# Patient Record
Sex: Female | Born: 1945 | Race: White | Hispanic: No | Smoking: Former smoker
Health system: Southern US, Community
[De-identification: ages and names within clinical notes are randomized; demographics above are authoritative.]

## PROBLEM LIST (undated history)

## (undated) DIAGNOSIS — I10 Essential (primary) hypertension: Secondary | ICD-10-CM

## (undated) DIAGNOSIS — H109 Unspecified conjunctivitis: Secondary | ICD-10-CM

## (undated) DIAGNOSIS — F329 Major depressive disorder, single episode, unspecified: Secondary | ICD-10-CM

## (undated) DIAGNOSIS — F32A Depression, unspecified: Secondary | ICD-10-CM

## (undated) DIAGNOSIS — K219 Gastro-esophageal reflux disease without esophagitis: Secondary | ICD-10-CM

## (undated) DIAGNOSIS — E785 Hyperlipidemia, unspecified: Secondary | ICD-10-CM

## (undated) DIAGNOSIS — F988 Other specified behavioral and emotional disorders with onset usually occurring in childhood and adolescence: Secondary | ICD-10-CM

## (undated) DIAGNOSIS — M1990 Unspecified osteoarthritis, unspecified site: Secondary | ICD-10-CM

## (undated) DIAGNOSIS — F419 Anxiety disorder, unspecified: Secondary | ICD-10-CM

## (undated) DIAGNOSIS — F429 Obsessive-compulsive disorder, unspecified: Secondary | ICD-10-CM

## (undated) DIAGNOSIS — M810 Age-related osteoporosis without current pathological fracture: Secondary | ICD-10-CM

## (undated) HISTORY — PX: ABDOMINAL HYSTERECTOMY: SHX81

## (undated) HISTORY — PX: BREAST LUMPECTOMY: SHX2

---

## 1998-10-14 ENCOUNTER — Other Ambulatory Visit: Admission: RE | Admit: 1998-10-14 | Discharge: 1998-10-14 | Payer: Self-pay | Admitting: Gastroenterology

## 1998-10-29 ENCOUNTER — Encounter (INDEPENDENT_AMBULATORY_CARE_PROVIDER_SITE_OTHER): Payer: Self-pay | Admitting: Specialist

## 1998-10-29 ENCOUNTER — Ambulatory Visit (HOSPITAL_COMMUNITY): Admission: RE | Admit: 1998-10-29 | Discharge: 1998-10-29 | Payer: Self-pay | Admitting: Gastroenterology

## 1999-12-29 ENCOUNTER — Encounter: Payer: Self-pay | Admitting: Internal Medicine

## 1999-12-29 ENCOUNTER — Encounter: Admission: RE | Admit: 1999-12-29 | Discharge: 1999-12-29 | Payer: Self-pay | Admitting: Internal Medicine

## 2000-05-30 ENCOUNTER — Encounter: Payer: Self-pay | Admitting: Internal Medicine

## 2000-05-30 ENCOUNTER — Encounter: Admission: RE | Admit: 2000-05-30 | Discharge: 2000-05-30 | Payer: Self-pay | Admitting: Internal Medicine

## 2000-12-29 ENCOUNTER — Encounter: Admission: RE | Admit: 2000-12-29 | Discharge: 2000-12-29 | Payer: Self-pay | Admitting: Internal Medicine

## 2000-12-29 ENCOUNTER — Encounter: Payer: Self-pay | Admitting: Internal Medicine

## 2002-05-08 ENCOUNTER — Encounter: Payer: Self-pay | Admitting: Internal Medicine

## 2002-05-08 ENCOUNTER — Encounter: Admission: RE | Admit: 2002-05-08 | Discharge: 2002-05-08 | Payer: Self-pay | Admitting: Internal Medicine

## 2003-06-09 ENCOUNTER — Ambulatory Visit (HOSPITAL_COMMUNITY): Admission: RE | Admit: 2003-06-09 | Discharge: 2003-06-09 | Payer: Self-pay | Admitting: Internal Medicine

## 2004-02-04 ENCOUNTER — Ambulatory Visit (HOSPITAL_COMMUNITY): Admission: RE | Admit: 2004-02-04 | Discharge: 2004-02-04 | Payer: Self-pay | Admitting: Gastroenterology

## 2004-02-06 ENCOUNTER — Ambulatory Visit (HOSPITAL_COMMUNITY): Admission: RE | Admit: 2004-02-06 | Discharge: 2004-02-06 | Payer: Self-pay | Admitting: Gastroenterology

## 2004-06-09 ENCOUNTER — Ambulatory Visit (HOSPITAL_COMMUNITY): Admission: RE | Admit: 2004-06-09 | Discharge: 2004-06-09 | Payer: Self-pay | Admitting: Internal Medicine

## 2005-06-10 ENCOUNTER — Ambulatory Visit (HOSPITAL_COMMUNITY): Admission: RE | Admit: 2005-06-10 | Discharge: 2005-06-10 | Payer: Self-pay | Admitting: Internal Medicine

## 2006-06-19 ENCOUNTER — Ambulatory Visit (HOSPITAL_COMMUNITY): Admission: RE | Admit: 2006-06-19 | Discharge: 2006-06-19 | Payer: Self-pay | Admitting: *Deleted

## 2006-06-27 ENCOUNTER — Ambulatory Visit (HOSPITAL_COMMUNITY): Admission: RE | Admit: 2006-06-27 | Discharge: 2006-06-27 | Payer: Self-pay | Admitting: *Deleted

## 2007-06-20 ENCOUNTER — Ambulatory Visit (HOSPITAL_COMMUNITY): Admission: RE | Admit: 2007-06-20 | Discharge: 2007-06-20 | Payer: Self-pay | Admitting: Internal Medicine

## 2008-06-20 ENCOUNTER — Ambulatory Visit (HOSPITAL_COMMUNITY): Admission: RE | Admit: 2008-06-20 | Discharge: 2008-06-20 | Payer: Self-pay | Admitting: Family Medicine

## 2008-06-23 ENCOUNTER — Other Ambulatory Visit: Admission: RE | Admit: 2008-06-23 | Discharge: 2008-06-23 | Payer: Self-pay | Admitting: Internal Medicine

## 2008-06-24 ENCOUNTER — Encounter: Admission: RE | Admit: 2008-06-24 | Discharge: 2008-06-24 | Payer: Self-pay | Admitting: Family Medicine

## 2009-06-25 ENCOUNTER — Ambulatory Visit (HOSPITAL_COMMUNITY): Admission: RE | Admit: 2009-06-25 | Discharge: 2009-06-25 | Payer: Self-pay | Admitting: Internal Medicine

## 2009-07-24 ENCOUNTER — Emergency Department (HOSPITAL_COMMUNITY): Admission: EM | Admit: 2009-07-24 | Discharge: 2009-07-25 | Payer: Self-pay | Admitting: Emergency Medicine

## 2010-03-28 ENCOUNTER — Encounter: Payer: Self-pay | Admitting: Family Medicine

## 2010-05-24 LAB — DIFFERENTIAL
Eosinophils Absolute: 0.4 10*3/uL (ref 0.0–0.7)
Lymphocytes Relative: 35 % (ref 12–46)
Lymphs Abs: 3.3 10*3/uL (ref 0.7–4.0)

## 2010-05-24 LAB — CBC
HCT: 40.6 % (ref 36.0–46.0)
Hemoglobin: 13.6 g/dL (ref 12.0–15.0)
MCHC: 33.6 g/dL (ref 30.0–36.0)
MCV: 95.3 fL (ref 78.0–100.0)
RBC: 4.26 MIL/uL (ref 3.87–5.11)

## 2010-05-24 LAB — BASIC METABOLIC PANEL
BUN: 20 mg/dL (ref 6–23)
Calcium: 9.3 mg/dL (ref 8.4–10.5)
Glucose, Bld: 100 mg/dL — ABNORMAL HIGH (ref 70–99)
Potassium: 3.4 mEq/L — ABNORMAL LOW (ref 3.5–5.1)

## 2010-06-10 ENCOUNTER — Other Ambulatory Visit (HOSPITAL_COMMUNITY): Payer: Self-pay | Admitting: Internal Medicine

## 2010-06-10 DIAGNOSIS — Z1231 Encounter for screening mammogram for malignant neoplasm of breast: Secondary | ICD-10-CM

## 2010-06-29 ENCOUNTER — Ambulatory Visit (HOSPITAL_COMMUNITY)
Admission: RE | Admit: 2010-06-29 | Discharge: 2010-06-29 | Disposition: A | Discharge status: Home / Self Care | Payer: BC Managed Care – PPO | Source: Ambulatory Visit | Attending: Internal Medicine | Admitting: Internal Medicine

## 2010-06-29 DIAGNOSIS — Z1231 Encounter for screening mammogram for malignant neoplasm of breast: Secondary | ICD-10-CM | POA: Insufficient documentation

## 2010-07-23 NOTE — Op Note (Signed)
NAMEMORENIKE, CUFF            ACCOUNT NO.:  [id number]   MEDICAL RECORD NO.:  [id number]          PATIENT TYPE:  AMB   LOCATION:  ENDO                         FACILITY:  [facility]   PHYSICIAN:  Danise Edge, M.D.   DATE OF BIRTH:  1946-02-13   DATE OF PROCEDURE:  02/04/2004  DATE OF DISCHARGE:                                 OPERATIVE REPORT   PROCEDURE PERFORMED:  Incomplete screening colonoscopy.   ENDOSCOPIST:  Charolett Bumpers, M.D.   INDICATIONS FOR PROCEDURE:  Ms. Kiaja Shorty is a 65 year old female  born 03/22/1945.  Approximately five years ago, Ms. Dunkerson underwent a  proctocolonoscopy to the cecum performed by me; two small adenomatous polyps  were removed from her colon.  She is scheduled for surveillance colonoscopy  with polypectomy to prevent colon cancer.   PREMEDICATION:  Versed 6 mg, Demerol 60 mg.   DESCRIPTION OF PROCEDURE:  After obtaining informed consent, Ms. Huntoon was  placed in the left lateral decubitus position.  I administered intravenous  Demerol and intravenous Versed to achieve conscious sedation for the  procedure.  The patient's blood pressure, oxygen saturations and cardiac  rhythm were monitored throughout the procedure and documented in the medical  record.   Anal inspection was normal.  Digital rectal exam was normal.  The pediatric  Olympus video colonoscope was introduced into the rectum and advanced to  hepatic flexure.  Colonic loop formation prevented advancement of the  endoscope around the hepatic flexure into the ascending colon.  The  ascending colon, cecum and ileocecal valve were not examined.  Colonic  preparation for the colonoscopy today was excellent.   Rectum:  Normal.   Sigmoid colon and descending colon:  Normal.   Splenic flexure:  Normal.   Transverse colon:  Normal.   Hepatic flexure:  Normal.   Ascending colon, cecum and ileocecal valve were not examined.   RECOMMENDATIONS:  Schedule elective  outpatient air contrast barium enema.      MJ/MEDQ  D:  02/04/2004  T:  02/04/2004  Job:  [id number]   cc:   Marcene Duos, M.D.  [place]  [place]  [place] [address]  Fax: [phone]

## 2011-03-29 DIAGNOSIS — E7800 Pure hypercholesterolemia, unspecified: Secondary | ICD-10-CM | POA: Diagnosis not present

## 2011-04-12 DIAGNOSIS — E7800 Pure hypercholesterolemia, unspecified: Secondary | ICD-10-CM | POA: Diagnosis not present

## 2011-04-12 DIAGNOSIS — F411 Generalized anxiety disorder: Secondary | ICD-10-CM | POA: Diagnosis not present

## 2011-06-20 DIAGNOSIS — E559 Vitamin D deficiency, unspecified: Secondary | ICD-10-CM | POA: Diagnosis not present

## 2011-06-20 DIAGNOSIS — E7800 Pure hypercholesterolemia, unspecified: Secondary | ICD-10-CM | POA: Diagnosis not present

## 2011-06-20 DIAGNOSIS — I10 Essential (primary) hypertension: Secondary | ICD-10-CM | POA: Diagnosis not present

## 2011-06-22 ENCOUNTER — Other Ambulatory Visit (HOSPITAL_COMMUNITY): Payer: Self-pay | Admitting: Internal Medicine

## 2011-06-22 DIAGNOSIS — Z1231 Encounter for screening mammogram for malignant neoplasm of breast: Secondary | ICD-10-CM

## 2011-06-23 DIAGNOSIS — F411 Generalized anxiety disorder: Secondary | ICD-10-CM | POA: Diagnosis not present

## 2011-06-23 DIAGNOSIS — I10 Essential (primary) hypertension: Secondary | ICD-10-CM | POA: Diagnosis not present

## 2011-06-23 DIAGNOSIS — Z23 Encounter for immunization: Secondary | ICD-10-CM | POA: Diagnosis not present

## 2011-06-23 DIAGNOSIS — E559 Vitamin D deficiency, unspecified: Secondary | ICD-10-CM | POA: Diagnosis not present

## 2011-06-23 DIAGNOSIS — E7800 Pure hypercholesterolemia, unspecified: Secondary | ICD-10-CM | POA: Diagnosis not present

## 2011-07-19 ENCOUNTER — Ambulatory Visit (HOSPITAL_COMMUNITY): Payer: BC Managed Care – PPO

## 2011-08-09 ENCOUNTER — Ambulatory Visit (HOSPITAL_COMMUNITY)
Admission: RE | Admit: 2011-08-09 | Discharge: 2011-08-09 | Disposition: A | Discharge status: Home / Self Care | Payer: Medicare Other | Source: Ambulatory Visit | Attending: Internal Medicine | Admitting: Internal Medicine

## 2011-08-09 DIAGNOSIS — Z1231 Encounter for screening mammogram for malignant neoplasm of breast: Secondary | ICD-10-CM

## 2011-08-16 ENCOUNTER — Ambulatory Visit (HOSPITAL_COMMUNITY): Payer: BC Managed Care – PPO

## 2011-11-21 DIAGNOSIS — Z23 Encounter for immunization: Secondary | ICD-10-CM | POA: Diagnosis not present

## 2011-12-16 DIAGNOSIS — E7800 Pure hypercholesterolemia, unspecified: Secondary | ICD-10-CM | POA: Diagnosis not present

## 2011-12-16 DIAGNOSIS — E559 Vitamin D deficiency, unspecified: Secondary | ICD-10-CM | POA: Diagnosis not present

## 2011-12-16 DIAGNOSIS — I10 Essential (primary) hypertension: Secondary | ICD-10-CM | POA: Diagnosis not present

## 2011-12-22 DIAGNOSIS — F329 Major depressive disorder, single episode, unspecified: Secondary | ICD-10-CM | POA: Diagnosis not present

## 2011-12-22 DIAGNOSIS — I10 Essential (primary) hypertension: Secondary | ICD-10-CM | POA: Diagnosis not present

## 2011-12-22 DIAGNOSIS — E7800 Pure hypercholesterolemia, unspecified: Secondary | ICD-10-CM | POA: Diagnosis not present

## 2012-01-22 ENCOUNTER — Emergency Department (HOSPITAL_COMMUNITY): Payer: Medicare Other

## 2012-01-22 ENCOUNTER — Encounter (HOSPITAL_COMMUNITY): Payer: Self-pay | Admitting: Emergency Medicine

## 2012-01-22 ENCOUNTER — Emergency Department (HOSPITAL_COMMUNITY)
Admission: EM | Admit: 2012-01-22 | Discharge: 2012-01-22 | Disposition: A | Discharge status: Home / Self Care | Payer: Medicare Other | Attending: Emergency Medicine | Admitting: Emergency Medicine

## 2012-01-22 DIAGNOSIS — Z87891 Personal history of nicotine dependence: Secondary | ICD-10-CM | POA: Insufficient documentation

## 2012-01-22 DIAGNOSIS — E785 Hyperlipidemia, unspecified: Secondary | ICD-10-CM | POA: Diagnosis not present

## 2012-01-22 DIAGNOSIS — Z7982 Long term (current) use of aspirin: Secondary | ICD-10-CM | POA: Diagnosis not present

## 2012-01-22 DIAGNOSIS — M2550 Pain in unspecified joint: Secondary | ICD-10-CM | POA: Insufficient documentation

## 2012-01-22 DIAGNOSIS — R51 Headache: Secondary | ICD-10-CM | POA: Insufficient documentation

## 2012-01-22 DIAGNOSIS — R109 Unspecified abdominal pain: Secondary | ICD-10-CM | POA: Diagnosis not present

## 2012-01-22 DIAGNOSIS — S298XXA Other specified injuries of thorax, initial encounter: Secondary | ICD-10-CM | POA: Diagnosis not present

## 2012-01-22 DIAGNOSIS — S0993XA Unspecified injury of face, initial encounter: Secondary | ICD-10-CM | POA: Diagnosis not present

## 2012-01-22 DIAGNOSIS — W19XXXA Unspecified fall, initial encounter: Secondary | ICD-10-CM

## 2012-01-22 DIAGNOSIS — S20219A Contusion of unspecified front wall of thorax, initial encounter: Secondary | ICD-10-CM | POA: Diagnosis not present

## 2012-01-22 DIAGNOSIS — Z8659 Personal history of other mental and behavioral disorders: Secondary | ICD-10-CM | POA: Diagnosis not present

## 2012-01-22 DIAGNOSIS — S2249XA Multiple fractures of ribs, unspecified side, initial encounter for closed fracture: Secondary | ICD-10-CM | POA: Diagnosis not present

## 2012-01-22 DIAGNOSIS — S3981XA Other specified injuries of abdomen, initial encounter: Secondary | ICD-10-CM | POA: Diagnosis not present

## 2012-01-22 DIAGNOSIS — Z79899 Other long term (current) drug therapy: Secondary | ICD-10-CM | POA: Diagnosis not present

## 2012-01-22 DIAGNOSIS — F329 Major depressive disorder, single episode, unspecified: Secondary | ICD-10-CM | POA: Insufficient documentation

## 2012-01-22 DIAGNOSIS — W1811XA Fall from or off toilet without subsequent striking against object, initial encounter: Secondary | ICD-10-CM | POA: Insufficient documentation

## 2012-01-22 DIAGNOSIS — F3289 Other specified depressive episodes: Secondary | ICD-10-CM | POA: Insufficient documentation

## 2012-01-22 DIAGNOSIS — I10 Essential (primary) hypertension: Secondary | ICD-10-CM | POA: Diagnosis not present

## 2012-01-22 DIAGNOSIS — Y9389 Activity, other specified: Secondary | ICD-10-CM | POA: Insufficient documentation

## 2012-01-22 DIAGNOSIS — M810 Age-related osteoporosis without current pathological fracture: Secondary | ICD-10-CM | POA: Diagnosis not present

## 2012-01-22 DIAGNOSIS — S0990XA Unspecified injury of head, initial encounter: Secondary | ICD-10-CM | POA: Diagnosis not present

## 2012-01-22 DIAGNOSIS — Y929 Unspecified place or not applicable: Secondary | ICD-10-CM | POA: Insufficient documentation

## 2012-01-22 HISTORY — DX: Anxiety disorder, unspecified: F41.9

## 2012-01-22 HISTORY — DX: Unspecified conjunctivitis: H10.9

## 2012-01-22 HISTORY — DX: Major depressive disorder, single episode, unspecified: F32.9

## 2012-01-22 HISTORY — DX: Hyperlipidemia, unspecified: E78.5

## 2012-01-22 HISTORY — DX: Depression, unspecified: F32.A

## 2012-01-22 HISTORY — DX: Essential (primary) hypertension: I10

## 2012-01-22 HISTORY — DX: Age-related osteoporosis without current pathological fracture: M81.0

## 2012-01-22 LAB — BASIC METABOLIC PANEL
BUN: 20 mg/dL (ref 6–23)
CO2: 28 mEq/L (ref 19–32)
Chloride: 98 mEq/L (ref 96–112)
GFR calc Af Amer: >90 mL/min (ref >90)
Glucose, Bld: 100 mg/dL — ABNORMAL HIGH (ref 70–99)
Potassium: 4.8 mEq/L (ref 3.5–5.1)
Sodium: 137 mEq/L (ref 135–145)

## 2012-01-22 LAB — CBC WITH DIFFERENTIAL/PLATELET
Hemoglobin: 13.4 g/dL (ref 12.0–15.0)
Lymphocytes Relative: 26 % (ref 12–46)
Lymphs Abs: 2.4 10*3/uL (ref 0.7–4.0)
MCH: 33.7 pg (ref 26.0–34.0)
Monocytes Relative: 8 % (ref 3–12)
Neutro Abs: 6.1 10*3/uL (ref 1.7–7.7)
Neutrophils Relative %: 66 % (ref 43–77)
Platelets: 248 10*3/uL (ref 150–400)
RBC: 3.98 MIL/uL (ref 3.87–5.11)
WBC: 9.3 10*3/uL (ref 4.0–10.5)

## 2012-01-22 LAB — PROTIME-INR: Prothrombin Time: 12.1 seconds (ref 11.6–15.2)

## 2012-01-22 MED ORDER — HYDROCODONE-ACETAMINOPHEN 5-325 MG PO TABS: 2.0000 | ORAL_TABLET | ORAL | Status: DC | PRN

## 2012-01-22 MED ORDER — IOHEXOL 300 MG/ML  SOLN
100.0000 mL | Freq: Once | INTRAMUSCULAR | Status: AC | PRN
  Administered 2012-01-22: 100 mL via INTRAVENOUS

## 2012-01-22 MED ORDER — HYDROCODONE-ACETAMINOPHEN 5-325 MG PO TABS
2.0000 | ORAL_TABLET | Freq: Once | ORAL | Status: AC
  Administered 2012-01-22: 2 via ORAL
  Filled 2012-01-22: qty 2

## 2012-01-22 MED ORDER — IBUPROFEN 600 MG PO TABS: 600.0000 mg | ORAL_TABLET | Freq: Four times a day (QID) | ORAL | Status: DC | PRN

## 2012-01-22 NOTE — ED Provider Notes (Signed)
History     CSN: [id number]  Arrival date & time 01/22/12  1553   First MD Initiated Contact with Patient 01/22/12 1642      Chief Complaint  Patient presents with  . Fall    (Consider location/radiation/quality/duration/timing/severity/associated sxs/prior treatment) HPI Comments: Patient presents with pain in her thoracic spine, left ribs, head and neck after a fall last night. She states she was sitting on the toilet and fell to the side because she was tired and struck her back on a ceramic toilet paper holder broke. She denies hitting her head or losing consciousness. She is uncertain how long she laid on the ground but was able to get up on her own. This happened at 11 PM. She denies any nausea, vomiting, chest pain or abdominal pain. She denies any focal weakness, numbness or tingling. She is not taking anticoagulants. She's complaining of pain in her left posterior ribs it radiates from her left chest. She did not take anything for pain.  The history is provided by the patient and a relative.    Past Medical History  Diagnosis Date  . Hypertension   . Osteoporosis   . Hyperlipemia   . Depression   . Anxiety   . Conjunctivitis     Past Surgical History  Procedure Date  . Abdominal hysterectomy     No family history on file.  History  Substance Use Topics  . Smoking status: Former Smoker -- 1.5 packs/day    Types: Cigarettes    Quit date: 03/07/1984  . Smokeless tobacco: Never Used  . Alcohol Use: 12.6 oz/week    21 Glasses of wine per week    OB History    Grav Para Term Preterm Abortions TAB SAB Ect Mult Living                  Review of Systems  Constitutional: Negative for fever, activity change and appetite change.  HENT: Negative for congestion and rhinorrhea.   Eyes: Negative for visual disturbance.  Respiratory: Negative for cough, chest tightness and shortness of breath.   Cardiovascular: Negative for chest pain.  Gastrointestinal: Negative  for nausea, vomiting and abdominal pain.  Musculoskeletal: Positive for myalgias, back pain and arthralgias.  Skin: Negative for rash.  Neurological: Positive for headaches. Negative for dizziness and weakness.    Allergies  Percocet  Home Medications   Current Outpatient Rx  Name  Route  Sig  Dispense  Refill  . ASPIRIN EC 81 MG PO TBEC   Oral   Take 81 mg by mouth daily.         . AZELASTINE HCL 0.05 % OP SOLN   Both Eyes   Place 1 drop into both eyes 2 (two) times daily.         . B COMPLEX PO TABS   Oral   Take 1 tablet by mouth daily.         Marland Kitchen CALCIUM CARBONATE-VITAMIN D 500-200 MG-UNIT PO TABS   Oral   Take 1 tablet by mouth daily.         Marland Kitchen VITAMIN D 1000 UNITS PO TABS   Oral   Take 2,000 Units by mouth daily.         Marland Kitchen CLONAZEPAM 0.5 MG PO TABS   Oral   Take 0.5 mg by mouth daily.         . COQ-10 PO   Oral   Take 500 mg by mouth daily.         Marland Kitchen  ESCITALOPRAM OXALATE 20 MG PO TABS   Oral   Take 20 mg by mouth daily.         . OMEGA-3 FATTY ACIDS 1000 MG PO CAPS   Oral   Take 1 g by mouth daily.         . ADULT MULTIVITAMIN W/MINERALS CH   Oral   Take 1 tablet by mouth daily.         Marland Kitchen ESTROVEN ENERGY PO   Oral   Take 1 tablet by mouth daily.         Marland Kitchen PRAVASTATIN SODIUM 80 MG PO TABS   Oral   Take 80 mg by mouth daily.         Marland Kitchen RAMIPRIL 10 MG PO CAPS   Oral   Take 10 mg by mouth daily.           BP 123/73  Pulse 56  Temp 98.1 F (36.7 C) (Oral)  Resp 20  Ht 5\' 1"  (1.549 m)  Wt 115 lb (52.164 kg)  BMI 21.73 kg/m2  SpO2 93%  Physical Exam  Constitutional: She is oriented to person, place, and time. She appears well-developed and well-nourished. No distress.  HENT:  Head: Normocephalic and atraumatic.  Mouth/Throat: Oropharynx is clear and moist. No oropharyngeal exudate.  Eyes: Conjunctivae normal are normal. Pupils are equal, round, and reactive to light.  Neck: Normal range of motion. Neck supple.         No C-spine pain for deformity a, stepoff or deformity  Cardiovascular: Normal rate, regular rhythm and normal heart sounds.   No murmur heard. Pulmonary/Chest: Effort normal and breath sounds normal. No respiratory distress.  Abdominal: Soft. There is no tenderness. There is no rebound and no guarding.  Musculoskeletal: Normal range of motion. She exhibits tenderness.       Tender to palpation over left posterior ribs in the mid thoracic area. No bruising, ecchymosis or step-off. There is no midline T. or L-spine pain.  Neurological: She is alert and oriented to person, place, and time. No cranial nerve deficit. She exhibits normal muscle tone. Coordination normal.       5/5 strength throughout, no ataxia on finger to nose, CN 2-12 intact.  Skin: Skin is warm.    ED Course  Procedures (including critical care time)  Labs Reviewed  BASIC METABOLIC PANEL - Abnormal; Notable for the following:    Glucose, Bld 100 (*)     All other components within normal limits  CBC WITH DIFFERENTIAL  PROTIME-INR  TROPONIN I   Dg Ribs Unilateral W/chest Left  01/22/2012  *RADIOLOGY REPORT*  Clinical Data: Left anterior mid rib pain, fall  LEFT RIBS AND CHEST - 3+ VIEW  Comparison: None  Findings: Upper-normal size of cardiac silhouette. Mediastinal contours and pulmonary vascularity normal. Lungs clear. No pleural effusion or pneumothorax. Minimal peribronchial thickening. Diffuse osseous demineralization. Age indeterminate fractures of the anterolateral left sixth and seventh ribs.  IMPRESSION: Minimal bronchitic changes. Age indeterminate fractures of the anterolateral left sixth and seventh ribs.   Original Report Authenticated By: Ulyses Southward, M.D.    Ct Head Wo Contrast  01/22/2012  *RADIOLOGY REPORT*  Clinical Data:  History of trauma from a fall.  CT HEAD WITHOUT CONTRAST CT CERVICAL SPINE WITHOUT CONTRAST  Technique:  Multidetector CT imaging of the head and cervical spine was performed  following the standard protocol without intravenous contrast.  Multiplanar CT image reconstructions of the cervical spine were also generated.  Comparison:  No priors.  CT HEAD  Findings: There is a background of mild cerebral and cerebellar atrophy in addition to extensive patchy and confluent areas of decreased attenuation throughout the deep and periventricular white matter of the cerebral hemispheres bilaterally, most compatible with chronic microvascular ischemic disease.  No acute displaced skull fractures are identified.  No acute intracranial abnormality. Specifically, no evidence of acute post-traumatic intracranial hemorrhage, no definite regions of acute/subacute cerebral ischemia, no focal mass, mass effect, hydrocephalus or abnormal intra or extra-axial fluid collections.  The visualized paranasal sinuses and mastoids are well pneumatized.  IMPRESSION: 1.  No acute displaced skull fractures or acute intracranial abnormalities. 2.  Mild cerebral and cerebellar atrophy in addition to chronic microvascular ischemic changes in the cerebral white matter, as above.  CT CERVICAL SPINE  Findings: No acute displaced cervical spine fractures.  Alignment is anatomic.  Prevertebral soft tissues are normal.  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  Mild multilevel degenerative disc disease and facet arthropathy is also noted.  Visualized portions of the upper thorax are unremarkable.  IMPRESSION: 1.  No evidence of significant acute traumatic injury to the cervical spine. 2.  Mild multilevel degenerative disc disease and cervical spondylosis. 3.  Congenital fusion of C3 and C4, as above.   Original Report Authenticated By: Trudie Reed, M.D.    Ct Chest W Contrast  01/22/2012  *RADIOLOGY REPORT*  Clinical Data:  Fall.  Upper back pain.  Hysterectomy.  Left-sided shoulder pain.  CT CHEST, ABDOMEN AND PELVIS WITH CONTRAST  Technique:  Multidetector CT  imaging of the chest, abdomen and pelvis was performed following the standard protocol during bolus administration of intravenous contrast.  Contrast: OMNIPAQUE IOHEXOL 300 MG/ML  SOLN  Comparison:   None.  CT CHEST  Findings:  No pneumothorax.  Scapula appears intact.  Sternum intact.  The no displaced rib fractures are identified.  Thoracic spinal alignment is within normal limits.  Vertebral body height is normal.  Mild thoracic degenerative disease.  No axillary, mediastinal, or hilar adenopathy. Aorta and branch vessels appear normal.  No airspace disease in the lungs.  5 millimeter right upper lobe pulmonary nodule (image 26 series 8). Calcified granuloma in the right lower lobe (image number 43 series 3.  IMPRESSION:  1.  No acute injury to the chest. 2.  5 mm right upper lobe peripheral pulmonary nodule. If the patient is at high risk for bronchogenic carcinoma, follow-up chest CT at 6-12 months is recommended.  If the patient is at low risk for bronchogenic carcinoma, follow-up chest CT at 12 months is recommended.  This recommendation follows the consensus statement: Guidelines for Management of Small Pulmonary Nodules Detected on CT Scans: A Statement from the Fleischner Society as published in Radiology 2005; 237:395-400. 3.  Old granulomatous disease.  CT ABDOMEN AND PELVIS  Findings:  Liver and spleen normal.  Gallbladder normal.  Common bile duct and pancreas normal.  Normal adrenal glands.  Normal renal enhancement and excretion.  The ureters appear within normal limits.  Urinary bladder normal.  Hysterectomy.  Stomach and small bowel appear normal.  The colon appears within normal limits. Lower lumbar spondylosis and facet arthrosis.  Pubic symphysis degenerative disease.  Vertebral body height in the lumbar spine is preserved.  Mild atherosclerosis.  IMPRESSION:  No traumatic injury to the abdomen or pelvis.  Hysterectomy.   Original Report Authenticated By: Andreas Newport, M.D.    Ct  Cervical Spine Wo Contrast  01/22/2012  *RADIOLOGY REPORT*  Clinical Data:  History of trauma from a fall.  CT HEAD WITHOUT CONTRAST CT CERVICAL SPINE WITHOUT CONTRAST  Technique:  Multidetector CT imaging of the head and cervical spine was performed following the standard protocol without intravenous contrast.  Multiplanar CT image reconstructions of the cervical spine were also generated.  Comparison:  No priors.  CT HEAD  Findings: There is a background of mild cerebral and cerebellar atrophy in addition to extensive patchy and confluent areas of decreased attenuation throughout the deep and periventricular white matter of the cerebral hemispheres bilaterally, most compatible with chronic microvascular ischemic disease.  No acute displaced skull fractures are identified.  No acute intracranial abnormality. Specifically, no evidence of acute post-traumatic intracranial hemorrhage, no definite regions of acute/subacute cerebral ischemia, no focal mass, mass effect, hydrocephalus or abnormal intra or extra-axial fluid collections.  The visualized paranasal sinuses and mastoids are well pneumatized.  IMPRESSION: 1.  No acute displaced skull fractures or acute intracranial abnormalities. 2.  Mild cerebral and cerebellar atrophy in addition to chronic microvascular ischemic changes in the cerebral white matter, as above.  CT CERVICAL SPINE  Findings: No acute displaced cervical spine fractures.  Alignment is anatomic.  Prevertebral soft tissues are normal.  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  Mild multilevel degenerative disc disease and facet arthropathy is also noted.  Visualized portions of the upper thorax are unremarkable.  IMPRESSION: 1.  No evidence of significant acute traumatic injury to the cervical spine. 2.  Mild multilevel degenerative disc disease and cervical spondylosis. 3.  Congenital fusion of C3 and C4, as above.   Original Report  Authenticated By: Trudie Reed, M.D.    Ct Abdomen Pelvis W Contrast  01/22/2012  *RADIOLOGY REPORT*  Clinical Data:  Fall.  Upper back pain.  Hysterectomy.  Left-sided shoulder pain.  CT CHEST, ABDOMEN AND PELVIS WITH CONTRAST  Technique:  Multidetector CT imaging of the chest, abdomen and pelvis was performed following the standard protocol during bolus administration of intravenous contrast.  Contrast: OMNIPAQUE IOHEXOL 300 MG/ML  SOLN  Comparison:   None.  CT CHEST  Findings:  No pneumothorax.  Scapula appears intact.  Sternum intact.  The no displaced rib fractures are identified.  Thoracic spinal alignment is within normal limits.  Vertebral body height is normal.  Mild thoracic degenerative disease.  No axillary, mediastinal, or hilar adenopathy. Aorta and branch vessels appear normal.  No airspace disease in the lungs.  5 millimeter right upper lobe pulmonary nodule (image 26 series 8). Calcified granuloma in the right lower lobe (image number 43 series 3.  IMPRESSION:  1.  No acute injury to the chest. 2.  5 mm right upper lobe peripheral pulmonary nodule. If the patient is at high risk for bronchogenic carcinoma, follow-up chest CT at 6-12 months is recommended.  If the patient is at low risk for bronchogenic carcinoma, follow-up chest CT at 12 months is recommended.  This recommendation follows the consensus statement: Guidelines for Management of Small Pulmonary Nodules Detected on CT Scans: A Statement from the Fleischner Society as published in Radiology 2005; 237:395-400. 3.  Old granulomatous disease.  CT ABDOMEN AND PELVIS  Findings:  Liver and spleen normal.  Gallbladder normal.  Common bile duct and pancreas normal.  Normal adrenal glands.  Normal renal enhancement and excretion.  The ureters appear within normal limits.  Urinary bladder normal.  Hysterectomy.  Stomach and small bowel appear normal.  The colon appears within normal limits. Lower lumbar spondylosis and facet arthrosis.   Pubic symphysis degenerative disease.  Vertebral body height in the lumbar spine is preserved.  Mild atherosclerosis.  IMPRESSION:  No traumatic injury to the abdomen or pelvis.  Hysterectomy.   Original Report Authenticated By: Andreas Newport, M.D.      No diagnosis found.    MDM  Back and rib pain after fall last night. Patient's uncertain of loss of consciousness. She has no focal neurological deficits her vital signs are stable. She has no abdominal pain or chest pain. His pain over her left posterior ribs it radiates from the front side  Imaging is negative for acute traumatic injury. Patient is informed of lung nodule finding and recommendation for repeat CT scan 1 year. Patient is ambulatory in the ED and tolerating PO.   EMERGENCY DEPARTMENT [place] FAST EXAM  INDICATIONS:Blunt trauma to the Thorax and Blunt injury of abdomen  PERFORMED BY: Myself  IMAGES ARCHIVED?: No  FINDINGS: All views negative  LIMITATIONS:    INTERPRETATION:  No abdominal free fluid and No pericardial effusion  COMMENT:  No free fluid in abdomen.   Date: 01/22/2012  Rate: 58  Rhythm: normal sinus rhythm  QRS Axis: normal  Intervals: normal  ST/T Wave abnormalities: normal  Conduction Disutrbances:none  Narrative Interpretation:   Old EKG Reviewed: none available    Glynn Octave, MD 01/22/12 1956

## 2012-01-22 NOTE — ED Notes (Signed)
Patient transported to CT 

## 2012-01-22 NOTE — ED Notes (Signed)
YNW:[id number]> Expected date:<BR> Expected time:<BR> Means of arrival:<BR> Comments:<BR> Hold for [employer]

## 2012-01-22 NOTE — ED Notes (Signed)
Pt complains of 8/10 upper back pain following an unwitnessed fall. Pt reports that she fell backwards last night and hit her head. Small hematoma noted, pt is unsure if she lost consciousness. Neuro check unremarkable.

## 2012-01-22 NOTE — ED Notes (Signed)
Patient transported to X-ray 

## 2012-03-09 ENCOUNTER — Encounter (HOSPITAL_COMMUNITY): Payer: Self-pay

## 2012-03-09 ENCOUNTER — Emergency Department (INDEPENDENT_AMBULATORY_CARE_PROVIDER_SITE_OTHER)
Admission: EM | Admit: 2012-03-09 | Discharge: 2012-03-09 | Disposition: A | Discharge status: Home / Self Care | Payer: Medicare Other | Source: Home / Self Care | Attending: Emergency Medicine | Admitting: Emergency Medicine

## 2012-03-09 DIAGNOSIS — S0100XA Unspecified open wound of scalp, initial encounter: Secondary | ICD-10-CM | POA: Diagnosis not present

## 2012-03-09 DIAGNOSIS — S0101XA Laceration without foreign body of scalp, initial encounter: Secondary | ICD-10-CM

## 2012-03-09 DIAGNOSIS — S0990XA Unspecified injury of head, initial encounter: Secondary | ICD-10-CM

## 2012-03-09 DIAGNOSIS — Z23 Encounter for immunization: Secondary | ICD-10-CM

## 2012-03-09 MED ORDER — TETANUS-DIPHTH-ACELL PERTUSSIS 5-2.5-18.5 LF-MCG/0.5 IM SUSP
0.5000 mL | Freq: Once | INTRAMUSCULAR | Status: AC
  Administered 2012-03-09: 0.5 mL via INTRAMUSCULAR

## 2012-03-09 MED ORDER — TETANUS-DIPHTH-ACELL PERTUSSIS 5-2.5-18.5 LF-MCG/0.5 IM SUSP
INTRAMUSCULAR | Status: AC
  Filled 2012-03-09: qty 0.5

## 2012-03-09 NOTE — ED Provider Notes (Signed)
History     CSN: [id number]  Arrival date & time 03/09/12  1056   First MD Initiated Contact with Patient 03/09/12 1228      Chief Complaint  Patient presents with  . Fall    (Consider location/radiation/quality/duration/timing/severity/associated sxs/prior treatment) HPI Comments: Patient presents urgent care describing that last night she fell she was trying to walk through a narrowand slipped hit the back of her head and some granite, which was broken. She bled some subsequently stopped. "I'm not sure but I think I  MIGHT passed out for a second or 2"... " I I do remember everything that happened, I think I might have had a couple of extra glasses of wine". "I decided to come in this morning to have the wound checked in case it needed stitches "?   No vomiting, no nausea no dizziness no visual changes, soreness is around the area of the cut.  Patient is a 67 y.o. female presenting with head injury. The history is provided by the patient.  Head Injury  The incident occurred yesterday. She came to the ER via walk-in. The injury mechanism was a fall. There was no loss of consciousness. The volume of blood lost was minimal. The quality of the pain is described as sharp. The pain is at a severity of 7/10. The pain is moderate. The pain has been constant since the injury. Pertinent negatives include no numbness, no vomiting, no disorientation, no weakness and no memory loss. She has tried ice for the symptoms. The treatment provided no relief.    Past Medical History  Diagnosis Date  . Hypertension   . Osteoporosis   . Hyperlipemia   . Depression   . Anxiety   . Conjunctivitis     Past Surgical History  Procedure Date  . Abdominal hysterectomy     No family history on file.  History  Substance Use Topics  . Smoking status: Former Smoker -- 1.5 packs/day    Types: Cigarettes    Quit date: 03/07/1984  . Smokeless tobacco: Never Used  . Alcohol Use: 12.6 oz/week    21  Glasses of wine per week    OB History    Grav Para Term Preterm Abortions TAB SAB Ect Mult Living                  Review of Systems  Constitutional: Negative for activity change and appetite change.  Eyes: Negative for visual disturbance.  Gastrointestinal: Negative for vomiting.  Skin: Positive for wound. Negative for rash.  Neurological: Negative for dizziness, tremors, syncope, facial asymmetry, speech difficulty, weakness, light-headedness, numbness and headaches.  Psychiatric/Behavioral: Negative for memory loss.    Allergies  Percocet  Home Medications   Current Outpatient Rx  Name  Route  Sig  Dispense  Refill  . ASPIRIN EC 81 MG PO TBEC   Oral   Take 81 mg by mouth daily.         . AZELASTINE HCL 0.05 % OP SOLN   Both Eyes   Place 1 drop into both eyes 2 (two) times daily.         . B COMPLEX PO TABS   Oral   Take 1 tablet by mouth daily.         Marland Kitchen CALCIUM CARBONATE-VITAMIN D 500-200 MG-UNIT PO TABS   Oral   Take 1 tablet by mouth daily.         Marland Kitchen VITAMIN D 1000 UNITS PO TABS   Oral  Take 2,000 Units by mouth daily.         Marland Kitchen CLONAZEPAM 0.5 MG PO TABS   Oral   Take 0.5 mg by mouth daily.         . COQ-10 PO   Oral   Take 500 mg by mouth daily.         Marland Kitchen ESCITALOPRAM OXALATE 20 MG PO TABS   Oral   Take 20 mg by mouth daily.         . OMEGA-3 FATTY ACIDS 1000 MG PO CAPS   Oral   Take 1 g by mouth daily.         Marland Kitchen HYDROCODONE-ACETAMINOPHEN 5-325 MG PO TABS   Oral   Take 2 tablets by mouth every 4 (four) hours as needed for pain.   10 tablet   0   . IBUPROFEN 600 MG PO TABS   Oral   Take 1 tablet (600 mg total) by mouth every 6 (six) hours as needed for pain.   30 tablet   0   . ADULT MULTIVITAMIN W/MINERALS CH   Oral   Take 1 tablet by mouth daily.         Marland Kitchen ESTROVEN ENERGY PO   Oral   Take 1 tablet by mouth daily.         Marland Kitchen PRAVASTATIN SODIUM 80 MG PO TABS   Oral   Take 80 mg by mouth daily.           Marland Kitchen RAMIPRIL 10 MG PO CAPS   Oral   Take 10 mg by mouth daily.           BP 179/93  Pulse 66  Temp 98.9 F (37.2 C) (Oral)  Resp 20  SpO2 100%  Physical Exam  Nursing note and vitals reviewed. Constitutional: She is oriented to person, place, and time. She appears well-developed and well-nourished.  HENT:  Head: Normocephalic.    Mouth/Throat: No oropharyngeal exudate.  Neck: Neck supple. No tracheal deviation present.  Neurological: She is alert and oriented to person, place, and time. She has normal strength. She displays normal reflexes. No cranial nerve deficit or sensory deficit. She exhibits normal muscle tone. Coordination normal. GCS eye subscore is 4. GCS verbal subscore is 5. GCS motor subscore is 6.  Skin: No rash noted. No erythema.    ED Course  LACERATION REPAIR Performed by: Jacaden Forbush Authorized by: Jimmie Molly Consent: Verbal consent obtained. Risks and benefits: risks, benefits and alternatives were discussed Consent given by: patient Patient understanding: patient states understanding of the procedure being performed Patient identity confirmed: verbally with patient Body area: head/neck Laceration length: 5 cm Contamination: The wound is contaminated. Foreign bodies: no foreign bodies Vascular damage: no Anesthesia: local infiltration Local anesthetic: lidocaine 1% with epinephrine Anesthetic total: 5 ml Irrigation solution: saline Irrigation method: syringe Amount of cleaning: extensive Debridement: none Degree of undermining: none Skin closure: staples Approximation: close Approximation difficulty: simple Dressing: antibiotic ointment Comments: Here he did bone upon leaving clean wound and surrounding skin with antiseptic solution.   (including critical care time)  Labs Reviewed - No data to display No results found.   1. Laceration of scalp   2. Head injury       MDM  Scalp laceration. Patient looks well comfortable jovial  and also with sense of humor. Tenderness is focal to area of laceration. No further or new neurological symptoms. Patient was alerted about symptoms that should warrant further evaluation in  the emergency department. She she understands need for further evaluation is needed.  Post discharge- approached by nurse as patient was asking to be evaluated as well for a potential referring body in her hand, that she has noticed for several months. I have instructed the nurse to inform patient that she has been discharged she can come back at another time so we can address this second complaint as per nurse assessment and doesn't look like there is any signs of infection. Have also instructed the nurse to tell the patient to followup with her primary care doctor about this or to return in a second visit.  Jimmie Molly, MD 03/09/12 1332

## 2012-03-09 NOTE — ED Notes (Signed)
States she lost her balance yest PM, struck her head in rock, sustained laceration to occipital area aprox 2-3 cm in length; bleeding controlled, no known LOC, NAD< MAEW, denies other injury

## 2012-03-21 ENCOUNTER — Encounter (HOSPITAL_COMMUNITY): Payer: Self-pay | Admitting: *Deleted

## 2012-03-21 ENCOUNTER — Emergency Department (INDEPENDENT_AMBULATORY_CARE_PROVIDER_SITE_OTHER)
Admission: EM | Admit: 2012-03-21 | Discharge: 2012-03-21 | Disposition: A | Discharge status: Home / Self Care | Payer: Medicare Other | Source: Home / Self Care | Attending: Emergency Medicine | Admitting: Emergency Medicine

## 2012-03-21 DIAGNOSIS — Z4802 Encounter for removal of sutures: Secondary | ICD-10-CM

## 2012-03-21 NOTE — ED Provider Notes (Signed)
History     CSN: [id number]  Arrival date & time 03/21/12  1218   First MD Initiated Contact with Patient 03/21/12 1225      Chief Complaint  Patient presents with  . Suture / Staple Removal    (Consider location/radiation/quality/duration/timing/severity/associated sxs/prior treatment) HPI Comments: Patient presents urgent care this afternoon to have staples removed. Has been 12 days and patient describes no concerns denies any increased swelling, redness or active drainage.  Patient is a 67 y.o. female presenting with suture removal. The history is provided by the patient.  Suture / Staple Removal  The sutures were placed 11 to 14 days ago. Treatments since wound repair include antibiotic ointment use. There has been no drainage from the wound. There is no redness present. There is no swelling present. She has no difficulty moving the affected extremity or digit.    Past Medical History  Diagnosis Date  . Hypertension   . Osteoporosis   . Hyperlipemia   . Depression   . Anxiety   . Conjunctivitis     Past Surgical History  Procedure Date  . Abdominal hysterectomy     Family History  Problem Relation Age of Onset  . Family history unknown: Yes    History  Substance Use Topics  . Smoking status: Former Smoker -- 1.5 packs/day    Types: Cigarettes    Quit date: 03/07/1984  . Smokeless tobacco: Never Used  . Alcohol Use: 12.6 oz/week    21 Glasses of wine per week    OB History    Grav Para Term Preterm Abortions TAB SAB Ect Mult Living                  Review of Systems  Constitutional: Negative for fever and chills.  Skin: Positive for wound.  Neurological: Negative for dizziness, light-headedness and headaches.    Allergies  Percocet  Home Medications   Current Outpatient Rx  Name  Route  Sig  Dispense  Refill  . ASPIRIN EC 81 MG PO TBEC   Oral   Take 81 mg by mouth daily.         . AZELASTINE HCL 0.05 % OP SOLN   Both Eyes   Place 1  drop into both eyes 2 (two) times daily.         . B COMPLEX PO TABS   Oral   Take 1 tablet by mouth daily.         Marland Kitchen CALCIUM CARBONATE-VITAMIN D 500-200 MG-UNIT PO TABS   Oral   Take 1 tablet by mouth daily.         Marland Kitchen VITAMIN D 1000 UNITS PO TABS   Oral   Take 2,000 Units by mouth daily.         Marland Kitchen CLONAZEPAM 0.5 MG PO TABS   Oral   Take 0.5 mg by mouth daily.         . COQ-10 PO   Oral   Take 500 mg by mouth daily.         Marland Kitchen ESCITALOPRAM OXALATE 20 MG PO TABS   Oral   Take 20 mg by mouth daily.         . OMEGA-3 FATTY ACIDS 1000 MG PO CAPS   Oral   Take 1 g by mouth daily.         Marland Kitchen HYDROCODONE-ACETAMINOPHEN 5-325 MG PO TABS   Oral   Take 2 tablets by mouth every 4 (four) hours as needed  for pain.   10 tablet   0   . IBUPROFEN 600 MG PO TABS   Oral   Take 1 tablet (600 mg total) by mouth every 6 (six) hours as needed for pain.   30 tablet   0   . ADULT MULTIVITAMIN W/MINERALS CH   Oral   Take 1 tablet by mouth daily.         Marland Kitchen ESTROVEN ENERGY PO   Oral   Take 1 tablet by mouth daily.         Marland Kitchen PRAVASTATIN SODIUM 80 MG PO TABS   Oral   Take 80 mg by mouth daily.         Marland Kitchen RAMIPRIL 10 MG PO CAPS   Oral   Take 10 mg by mouth daily.           BP 131/66  Pulse 51  Temp 98 F (36.7 C) (Oral)  Resp 16  SpO2 99%  Physical Exam  Nursing note and vitals reviewed. Constitutional: She is oriented to person, place, and time. She appears well-developed and well-nourished. No distress.  HENT:  Head:    Neurological: She is alert and oriented to person, place, and time. She has normal strength. No cranial nerve deficit or sensory deficit.  Skin: Skin is warm. No erythema.    ED Course  Procedures (including critical care time)  Labs Reviewed - No data to display No results found.   1. Encounter for staple removal       MDM  uncomplicated staple removal.      a  Jimmie Molly, MD 03/21/12 1329

## 2012-03-21 NOTE — ED Notes (Signed)
3 staples removed - laceration clean, well healed - no redness or s/s of infection present

## 2012-04-21 ENCOUNTER — Other Ambulatory Visit: Payer: Self-pay

## 2012-06-07 DIAGNOSIS — H04129 Dry eye syndrome of unspecified lacrimal gland: Secondary | ICD-10-CM | POA: Diagnosis not present

## 2012-06-07 DIAGNOSIS — H2510 Age-related nuclear cataract, unspecified eye: Secondary | ICD-10-CM | POA: Diagnosis not present

## 2012-06-07 DIAGNOSIS — H43819 Vitreous degeneration, unspecified eye: Secondary | ICD-10-CM | POA: Diagnosis not present

## 2012-06-07 DIAGNOSIS — H1045 Other chronic allergic conjunctivitis: Secondary | ICD-10-CM | POA: Diagnosis not present

## 2012-10-10 ENCOUNTER — Other Ambulatory Visit: Payer: Self-pay

## 2012-10-22 ENCOUNTER — Other Ambulatory Visit (HOSPITAL_COMMUNITY): Payer: Self-pay | Admitting: Internal Medicine

## 2012-10-22 DIAGNOSIS — Z1231 Encounter for screening mammogram for malignant neoplasm of breast: Secondary | ICD-10-CM

## 2012-10-25 ENCOUNTER — Ambulatory Visit (HOSPITAL_COMMUNITY): Payer: Medicare Other

## 2012-11-29 DIAGNOSIS — Z23 Encounter for immunization: Secondary | ICD-10-CM | POA: Diagnosis not present

## 2013-01-10 ENCOUNTER — Other Ambulatory Visit: Payer: Self-pay

## 2013-01-22 DIAGNOSIS — T6391XA Toxic effect of contact with unspecified venomous animal, accidental (unintentional), initial encounter: Secondary | ICD-10-CM | POA: Diagnosis not present

## 2013-03-26 DIAGNOSIS — I10 Essential (primary) hypertension: Secondary | ICD-10-CM | POA: Diagnosis not present

## 2013-03-26 DIAGNOSIS — E7800 Pure hypercholesterolemia, unspecified: Secondary | ICD-10-CM | POA: Diagnosis not present

## 2013-03-26 DIAGNOSIS — R7309 Other abnormal glucose: Secondary | ICD-10-CM | POA: Diagnosis not present

## 2013-03-26 DIAGNOSIS — M2540 Effusion, unspecified joint: Secondary | ICD-10-CM | POA: Diagnosis not present

## 2013-03-26 DIAGNOSIS — R300 Dysuria: Secondary | ICD-10-CM | POA: Diagnosis not present

## 2013-03-26 DIAGNOSIS — M19049 Primary osteoarthritis, unspecified hand: Secondary | ICD-10-CM | POA: Diagnosis not present

## 2013-03-26 DIAGNOSIS — Z79899 Other long term (current) drug therapy: Secondary | ICD-10-CM | POA: Diagnosis not present

## 2013-03-26 DIAGNOSIS — M79609 Pain in unspecified limb: Secondary | ICD-10-CM | POA: Diagnosis not present

## 2013-04-02 ENCOUNTER — Ambulatory Visit (HOSPITAL_COMMUNITY)
Admission: RE | Admit: 2013-04-02 | Discharge: 2013-04-02 | Disposition: A | Discharge status: Home / Self Care | Payer: Medicare Other | Source: Ambulatory Visit | Attending: Internal Medicine | Admitting: Internal Medicine

## 2013-04-02 DIAGNOSIS — Z1231 Encounter for screening mammogram for malignant neoplasm of breast: Secondary | ICD-10-CM

## 2013-04-16 DIAGNOSIS — F411 Generalized anxiety disorder: Secondary | ICD-10-CM | POA: Diagnosis not present

## 2013-04-16 DIAGNOSIS — E7800 Pure hypercholesterolemia, unspecified: Secondary | ICD-10-CM | POA: Diagnosis not present

## 2013-07-10 DIAGNOSIS — I10 Essential (primary) hypertension: Secondary | ICD-10-CM | POA: Diagnosis not present

## 2013-07-10 DIAGNOSIS — E7800 Pure hypercholesterolemia, unspecified: Secondary | ICD-10-CM | POA: Diagnosis not present

## 2013-07-16 DIAGNOSIS — E785 Hyperlipidemia, unspecified: Secondary | ICD-10-CM | POA: Diagnosis not present

## 2013-07-16 DIAGNOSIS — F3289 Other specified depressive episodes: Secondary | ICD-10-CM | POA: Diagnosis not present

## 2013-07-16 DIAGNOSIS — F329 Major depressive disorder, single episode, unspecified: Secondary | ICD-10-CM | POA: Diagnosis not present

## 2013-07-30 DIAGNOSIS — Z1331 Encounter for screening for depression: Secondary | ICD-10-CM | POA: Diagnosis not present

## 2013-07-30 DIAGNOSIS — Z0000 Encounter for general adult medical examination without abnormal findings: Secondary | ICD-10-CM | POA: Diagnosis not present

## 2013-07-30 DIAGNOSIS — I10 Essential (primary) hypertension: Secondary | ICD-10-CM | POA: Diagnosis not present

## 2013-07-30 DIAGNOSIS — E7800 Pure hypercholesterolemia, unspecified: Secondary | ICD-10-CM | POA: Diagnosis not present

## 2013-08-06 DIAGNOSIS — T148XXA Other injury of unspecified body region, initial encounter: Secondary | ICD-10-CM | POA: Diagnosis not present

## 2013-08-22 ENCOUNTER — Ambulatory Visit
Admission: RE | Admit: 2013-08-22 | Discharge: 2013-08-22 | Disposition: A | Discharge status: Home / Self Care | Payer: Medicare Other | Source: Ambulatory Visit | Attending: Internal Medicine | Admitting: Internal Medicine

## 2013-08-22 ENCOUNTER — Other Ambulatory Visit: Payer: Self-pay | Admitting: Internal Medicine

## 2013-08-22 DIAGNOSIS — R4781 Slurred speech: Secondary | ICD-10-CM

## 2013-08-22 DIAGNOSIS — R4789 Other speech disturbances: Secondary | ICD-10-CM | POA: Diagnosis not present

## 2013-08-22 DIAGNOSIS — H538 Other visual disturbances: Secondary | ICD-10-CM | POA: Diagnosis not present

## 2013-08-22 DIAGNOSIS — R42 Dizziness and giddiness: Secondary | ICD-10-CM | POA: Diagnosis not present

## 2013-10-23 DIAGNOSIS — Z79899 Other long term (current) drug therapy: Secondary | ICD-10-CM | POA: Diagnosis not present

## 2013-10-23 DIAGNOSIS — I10 Essential (primary) hypertension: Secondary | ICD-10-CM | POA: Diagnosis not present

## 2013-10-23 DIAGNOSIS — E7800 Pure hypercholesterolemia, unspecified: Secondary | ICD-10-CM | POA: Diagnosis not present

## 2013-10-30 DIAGNOSIS — F3289 Other specified depressive episodes: Secondary | ICD-10-CM | POA: Diagnosis not present

## 2013-10-30 DIAGNOSIS — F329 Major depressive disorder, single episode, unspecified: Secondary | ICD-10-CM | POA: Diagnosis not present

## 2013-10-30 DIAGNOSIS — E785 Hyperlipidemia, unspecified: Secondary | ICD-10-CM | POA: Diagnosis not present

## 2013-10-30 DIAGNOSIS — R7309 Other abnormal glucose: Secondary | ICD-10-CM | POA: Diagnosis not present

## 2013-12-05 DIAGNOSIS — S0086XA Insect bite (nonvenomous) of other part of head, initial encounter: Secondary | ICD-10-CM | POA: Diagnosis not present

## 2013-12-05 DIAGNOSIS — L538 Other specified erythematous conditions: Secondary | ICD-10-CM | POA: Diagnosis not present

## 2013-12-25 DIAGNOSIS — Z23 Encounter for immunization: Secondary | ICD-10-CM | POA: Diagnosis not present

## 2014-03-13 ENCOUNTER — Ambulatory Visit
Admission: RE | Admit: 2014-03-13 | Discharge: 2014-03-13 | Disposition: A | Discharge status: Home / Self Care | Payer: Medicare Other | Source: Ambulatory Visit | Attending: Internal Medicine | Admitting: Internal Medicine

## 2014-03-13 ENCOUNTER — Other Ambulatory Visit: Payer: Self-pay | Admitting: Internal Medicine

## 2014-03-13 DIAGNOSIS — R109 Unspecified abdominal pain: Secondary | ICD-10-CM

## 2014-03-13 DIAGNOSIS — R195 Other fecal abnormalities: Secondary | ICD-10-CM | POA: Diagnosis not present

## 2014-03-13 DIAGNOSIS — R7309 Other abnormal glucose: Secondary | ICD-10-CM | POA: Diagnosis not present

## 2014-03-13 DIAGNOSIS — Z1211 Encounter for screening for malignant neoplasm of colon: Secondary | ICD-10-CM | POA: Diagnosis not present

## 2014-03-13 DIAGNOSIS — R1084 Generalized abdominal pain: Secondary | ICD-10-CM | POA: Diagnosis not present

## 2014-03-18 ENCOUNTER — Other Ambulatory Visit (HOSPITAL_COMMUNITY): Payer: Self-pay | Admitting: Internal Medicine

## 2014-03-18 DIAGNOSIS — Z1231 Encounter for screening mammogram for malignant neoplasm of breast: Secondary | ICD-10-CM

## 2014-04-02 DIAGNOSIS — L309 Dermatitis, unspecified: Secondary | ICD-10-CM | POA: Diagnosis not present

## 2014-04-03 ENCOUNTER — Ambulatory Visit (HOSPITAL_COMMUNITY)
Admission: RE | Admit: 2014-04-03 | Discharge: 2014-04-03 | Disposition: A | Discharge status: Home / Self Care | Payer: Medicare Other | Source: Ambulatory Visit | Attending: Internal Medicine | Admitting: Internal Medicine

## 2014-04-03 DIAGNOSIS — Z1231 Encounter for screening mammogram for malignant neoplasm of breast: Secondary | ICD-10-CM | POA: Diagnosis not present

## 2014-04-30 DIAGNOSIS — I10 Essential (primary) hypertension: Secondary | ICD-10-CM | POA: Diagnosis not present

## 2014-04-30 DIAGNOSIS — R7309 Other abnormal glucose: Secondary | ICD-10-CM | POA: Diagnosis not present

## 2014-04-30 DIAGNOSIS — E559 Vitamin D deficiency, unspecified: Secondary | ICD-10-CM | POA: Diagnosis not present

## 2014-04-30 DIAGNOSIS — F329 Major depressive disorder, single episode, unspecified: Secondary | ICD-10-CM | POA: Diagnosis not present

## 2014-04-30 DIAGNOSIS — E785 Hyperlipidemia, unspecified: Secondary | ICD-10-CM | POA: Diagnosis not present

## 2014-05-06 DIAGNOSIS — R7309 Other abnormal glucose: Secondary | ICD-10-CM | POA: Diagnosis not present

## 2014-05-06 DIAGNOSIS — M8580 Other specified disorders of bone density and structure, unspecified site: Secondary | ICD-10-CM | POA: Diagnosis not present

## 2014-05-06 DIAGNOSIS — Z23 Encounter for immunization: Secondary | ICD-10-CM | POA: Diagnosis not present

## 2014-05-06 DIAGNOSIS — E780 Pure hypercholesterolemia: Secondary | ICD-10-CM | POA: Diagnosis not present

## 2014-05-06 DIAGNOSIS — F411 Generalized anxiety disorder: Secondary | ICD-10-CM | POA: Diagnosis not present

## 2014-05-06 DIAGNOSIS — Z0001 Encounter for general adult medical examination with abnormal findings: Secondary | ICD-10-CM | POA: Diagnosis not present

## 2014-05-07 ENCOUNTER — Other Ambulatory Visit: Payer: Self-pay | Admitting: Gastroenterology

## 2014-05-21 ENCOUNTER — Encounter (HOSPITAL_COMMUNITY): Payer: Self-pay | Admitting: *Deleted

## 2014-05-27 DIAGNOSIS — R911 Solitary pulmonary nodule: Secondary | ICD-10-CM | POA: Diagnosis not present

## 2014-06-02 ENCOUNTER — Encounter (HOSPITAL_COMMUNITY): Payer: Self-pay | Admitting: Gastroenterology

## 2014-06-02 ENCOUNTER — Ambulatory Visit (HOSPITAL_COMMUNITY): Payer: Medicare Other | Admitting: Anesthesiology

## 2014-06-02 ENCOUNTER — Ambulatory Visit (HOSPITAL_COMMUNITY)
Admission: RE | Admit: 2014-06-02 | Discharge: 2014-06-02 | Disposition: A | Discharge status: Home / Self Care | Payer: Medicare Other | Source: Ambulatory Visit | Attending: Gastroenterology | Admitting: Gastroenterology

## 2014-06-02 ENCOUNTER — Encounter (HOSPITAL_COMMUNITY)
Admission: RE | Disposition: A | Discharge status: Home / Self Care | Payer: Self-pay | Source: Ambulatory Visit | Attending: Gastroenterology

## 2014-06-02 DIAGNOSIS — I10 Essential (primary) hypertension: Secondary | ICD-10-CM | POA: Insufficient documentation

## 2014-06-02 DIAGNOSIS — Z1211 Encounter for screening for malignant neoplasm of colon: Secondary | ICD-10-CM | POA: Diagnosis not present

## 2014-06-02 DIAGNOSIS — M1990 Unspecified osteoarthritis, unspecified site: Secondary | ICD-10-CM | POA: Insufficient documentation

## 2014-06-02 DIAGNOSIS — F419 Anxiety disorder, unspecified: Secondary | ICD-10-CM | POA: Diagnosis not present

## 2014-06-02 DIAGNOSIS — F329 Major depressive disorder, single episode, unspecified: Secondary | ICD-10-CM | POA: Insufficient documentation

## 2014-06-02 DIAGNOSIS — Z90710 Acquired absence of both cervix and uterus: Secondary | ICD-10-CM | POA: Diagnosis not present

## 2014-06-02 DIAGNOSIS — Z86010 Personal history of colonic polyps: Secondary | ICD-10-CM | POA: Diagnosis not present

## 2014-06-02 DIAGNOSIS — F988 Other specified behavioral and emotional disorders with onset usually occurring in childhood and adolescence: Secondary | ICD-10-CM | POA: Diagnosis not present

## 2014-06-02 DIAGNOSIS — F42 Obsessive-compulsive disorder: Secondary | ICD-10-CM | POA: Diagnosis not present

## 2014-06-02 DIAGNOSIS — M810 Age-related osteoporosis without current pathological fracture: Secondary | ICD-10-CM | POA: Diagnosis not present

## 2014-06-02 DIAGNOSIS — E785 Hyperlipidemia, unspecified: Secondary | ICD-10-CM | POA: Insufficient documentation

## 2014-06-02 DIAGNOSIS — R911 Solitary pulmonary nodule: Secondary | ICD-10-CM | POA: Diagnosis not present

## 2014-06-02 DIAGNOSIS — Z7982 Long term (current) use of aspirin: Secondary | ICD-10-CM | POA: Insufficient documentation

## 2014-06-02 DIAGNOSIS — Z87891 Personal history of nicotine dependence: Secondary | ICD-10-CM | POA: Insufficient documentation

## 2014-06-02 HISTORY — DX: Unspecified osteoarthritis, unspecified site: M19.90

## 2014-06-02 HISTORY — DX: Obsessive-compulsive disorder, unspecified: F42.9

## 2014-06-02 HISTORY — PX: COLONOSCOPY WITH PROPOFOL: SHX5780

## 2014-06-02 HISTORY — DX: Other specified behavioral and emotional disorders with onset usually occurring in childhood and adolescence: F98.8

## 2014-06-02 SURGERY — COLONOSCOPY WITH PROPOFOL
Anesthesia: Monitor Anesthesia Care

## 2014-06-02 MED ORDER — ONDANSETRON HCL 4 MG/2ML IJ SOLN
INTRAMUSCULAR | Status: AC
  Filled 2014-06-02: qty 2

## 2014-06-02 MED ORDER — PROPOFOL 10 MG/ML IV BOLUS
INTRAVENOUS | Status: AC
  Filled 2014-06-02: qty 20

## 2014-06-02 MED ORDER — PROPOFOL INFUSION 10 MG/ML OPTIME
INTRAVENOUS | Status: DC | PRN
  Administered 2014-06-02: 100 ug/kg/min via INTRAVENOUS

## 2014-06-02 MED ORDER — ONDANSETRON HCL 4 MG/2ML IJ SOLN
INTRAMUSCULAR | Status: DC | PRN
  Administered 2014-06-02: 4 mg via INTRAVENOUS

## 2014-06-02 MED ORDER — PROPOFOL 10 MG/ML IV BOLUS
INTRAVENOUS | Status: DC | PRN
  Administered 2014-06-02: 20 mg via INTRAVENOUS
  Administered 2014-06-02: 40 mg via INTRAVENOUS
  Administered 2014-06-02: 20 mg via INTRAVENOUS
  Administered 2014-06-02: 50 mg via INTRAVENOUS
  Administered 2014-06-02 (×5): 20 mg via INTRAVENOUS

## 2014-06-02 MED ORDER — LACTATED RINGERS IV SOLN
INTRAVENOUS | Status: DC
  Administered 2014-06-02: 1000 mL via INTRAVENOUS

## 2014-06-02 MED ORDER — SODIUM CHLORIDE 0.9 % IV SOLN: INTRAVENOUS | Status: DC

## 2014-06-02 SURGICAL SUPPLY — 22 items

## 2014-06-02 NOTE — Anesthesia Preprocedure Evaluation (Signed)
Anesthesia Evaluation  Patient identified by MRN, date of birth, ID band Patient awake    Reviewed: Allergy & Precautions, NPO status , Patient's Chart, lab work & pertinent test results  Airway Mallampati: II  TM Distance: >3 FB Neck ROM: Full    Dental no notable dental hx.    Pulmonary neg pulmonary ROS, former smoker,  breath sounds clear to auscultation  Pulmonary exam normal       Cardiovascular hypertension, Rhythm:Regular Rate:Normal     Neuro/Psych negative neurological ROS  negative psych ROS   GI/Hepatic negative GI ROS, Neg liver ROS,   Endo/Other  negative endocrine ROS  Renal/GU negative Renal ROS  negative genitourinary   Musculoskeletal negative musculoskeletal ROS (+)   Abdominal   Peds negative pediatric ROS (+)  Hematology negative hematology ROS (+)   Anesthesia Other Findings   Reproductive/Obstetrics negative OB ROS                             Anesthesia Physical Anesthesia Plan  ASA: II  Anesthesia Plan: MAC   Post-op Pain Management:    Induction:   Airway Management Planned: Simple Face Mask  Additional Equipment:   Intra-op Plan:   Post-operative Plan:   Informed Consent: I have reviewed the patients History and Physical, chart, labs and discussed the procedure including the risks, benefits and alternatives for the proposed anesthesia with the patient or authorized representative who has indicated his/her understanding and acceptance.   Dental advisory given  Plan Discussed with: CRNA  Anesthesia Plan Comments:         Anesthesia Quick Evaluation

## 2014-06-02 NOTE — Transfer of Care (Signed)
Immediate Anesthesia Transfer of Care Note  Patient: Selena Morris  Procedure(s) Performed: Procedure(s): COLONOSCOPY WITH PROPOFOL (N/A)  Patient Location: PACU and Endoscopy Unit  Anesthesia Type:MAC  Level of Consciousness: awake, alert , oriented and patient cooperative  Airway & Oxygen Therapy: Patient Spontanous Breathing and Patient connected to face mask oxygen  Post-op Assessment: Report given to RN and Post -op Vital signs reviewed and stable  Post vital signs: Reviewed and stable  Last Vitals:  Filed Vitals:   06/02/14 1207  BP: 168/72  Pulse: 64  Temp: 37 C  Resp: 25    Complications: No apparent anesthesia complications

## 2014-06-02 NOTE — Op Note (Signed)
Procedure: Surveillance colonoscopy. 10/29/1998 colonoscopy performed with removal of two diminutive adenomatous colon polyps. 02/04/2004 normal surveillance colonoscopy to the hepatic flexure. 01/22/2012 CT scan abdomen and pelvis post hysterectomy was normal. 01/21/2014 CT scan of the chest showed a 5 mm right upper lobe peripheral pulmonary nodule. Repeat CT scan at 12 months was recommended by radiology  Endoscopist: Danise EdgeMartin Johnson  Premedication: Propofol administered by anesthesia  Procedure: The patient was placed in the left lateral decubitus position. Anal inspection and digital rectal exam were normal. The Pentax pediatric colonoscope was introduced into the rectum and advanced to the cecum. A normal-appearing appendiceal orifice and ileocecal valve were identified. Colonic preparation for the exam today was good. Withdrawal time was 7 minutes  Rectum. Normal. Retroflexed view of the distal rectum normal  Sigmoid colon and descending colon. Normal  Splenic flexure. Normal  Transverse colon. Normal  Hepatic flexure. Normal  Ascending colon. Normal  Cecum and ileocecal valve. Normal  Assessment: Normal surveillance colonoscopy  Recommendation: Schedule follow-up CT scan of the chest to evaluate 5 mm right upper lobe pulmonary nodule diagnosed by CT scan in November 2013 if a follow-up scan has not been performed.

## 2014-06-02 NOTE — Anesthesia Postprocedure Evaluation (Signed)
  Anesthesia Post-op Note  Patient: Selena Morris  Procedure(s) Performed: Procedure(s) (LRB): COLONOSCOPY WITH PROPOFOL (N/A)  Patient Location: PACU  Anesthesia Type: MAC  Level of Consciousness: awake and alert   Airway and Oxygen Therapy: Patient Spontanous Breathing  Post-op Pain: mild  Post-op Assessment: Post-op Vital signs reviewed, Patient's Cardiovascular Status Stable, Respiratory Function Stable, Patent Airway and No signs of Nausea or vomiting  Last Vitals:  Filed Vitals:   06/02/14 1410  BP: 170/88  Pulse: 61  Temp:   Resp: 16    Post-op Vital Signs: stable   Complications: No apparent anesthesia complications

## 2014-06-02 NOTE — Discharge Instructions (Signed)
Colonoscopy, Care After °These instructions give you information on caring for yourself after your procedure. Your doctor may also give you more specific instructions. Call your doctor if you have any problems or questions after your procedure. °HOME CARE °· Do not drive for 24 hours. °· Do not sign important papers or use machinery for 24 hours. °· You may shower. °· You may go back to your usual activities, but go slower for the first 24 hours. °· Take rest breaks often during the first 24 hours. °· Walk around or use warm packs on your belly (abdomen) if you have belly cramping or gas. °· Drink enough fluids to keep your pee (urine) clear or pale yellow. °· Resume your normal diet. Avoid heavy or fried foods. °· Avoid drinking alcohol for 24 hours or as told by your doctor. °· Only take medicines as told by your doctor. °If a tissue sample (biopsy) was taken during the procedure:  °· Do not take aspirin or blood thinners for 7 days, or as told by your doctor. °· Do not drink alcohol for 7 days, or as told by your doctor. °· Eat soft foods for the first 24 hours. °GET HELP IF: °You still have a small amount of blood in your poop (stool) 2-3 days after the procedure. °GET HELP RIGHT AWAY IF: °· You have more than a small amount of blood in your poop. °· You see clumps of tissue (blood clots) in your poop. °· Your belly is puffy (swollen). °· You feel sick to your stomach (nauseous) or throw up (vomit). °· You have a fever. °· You have belly pain that gets worse and medicine does not help. °MAKE SURE YOU: °· Understand these instructions. °· Will watch your condition. °· Will get help right away if you are not doing well or get worse. °Document Released: 03/26/2010 Document Revised: 02/26/2013 Document Reviewed: 10/29/2012 °[employer]® Patient Information ©2015 [employer]. This information is not intended to replace advice given to you by your health care provider. Make sure you discuss any questions you have with  your health care provider. ° °

## 2014-06-02 NOTE — H&P (Signed)
  Procedure: Surveillance colonoscopy. 10/29/1998 colonoscopy performed with removal two diminutive adenomatous colon polyps. 02/04/2004 normal screening colonoscopy to the hepatic flexure. The right colon was not examined. 01/22/2012 CT scan of the abdomen and pelvis post hysterectomy was normal. 01/22/2012 CT scan of the chest showed a 5 mm right upper lobe peripheral pulmonary nodule. Repeat chest CT scan recommended in 12 months  History: The patient is a 69 year old female born Dec 31, 1945. She is scheduled to undergo a repeat surveillance colonoscopy today.  Medication allergies: Percodan and Tylox  Past medical history: Hypercholesterolemia. Hysterectomy.  Exam: The patient is alert and lying comfortably on the endoscopy stretcher. Abdomen is soft and nontender to palpation. Lungs are clear to auscultation. Cardiac exam reveals a regular rhythm.  Plan: Proceed with surveillance colonoscopy

## 2014-06-03 ENCOUNTER — Encounter (HOSPITAL_COMMUNITY): Payer: Self-pay | Admitting: Gastroenterology

## 2014-06-05 ENCOUNTER — Other Ambulatory Visit: Payer: Self-pay | Admitting: Gastroenterology

## 2014-06-05 DIAGNOSIS — Z01812 Encounter for preprocedural laboratory examination: Secondary | ICD-10-CM | POA: Diagnosis not present

## 2014-06-05 DIAGNOSIS — R911 Solitary pulmonary nodule: Secondary | ICD-10-CM

## 2014-06-10 DIAGNOSIS — H02834 Dermatochalasis of left upper eyelid: Secondary | ICD-10-CM | POA: Diagnosis not present

## 2014-06-10 DIAGNOSIS — H43813 Vitreous degeneration, bilateral: Secondary | ICD-10-CM | POA: Diagnosis not present

## 2014-06-10 DIAGNOSIS — H25813 Combined forms of age-related cataract, bilateral: Secondary | ICD-10-CM | POA: Diagnosis not present

## 2014-06-10 DIAGNOSIS — H02831 Dermatochalasis of right upper eyelid: Secondary | ICD-10-CM | POA: Diagnosis not present

## 2014-06-12 ENCOUNTER — Ambulatory Visit
Admission: RE | Admit: 2014-06-12 | Discharge: 2014-06-12 | Disposition: A | Discharge status: Home / Self Care | Payer: Medicare Other | Source: Ambulatory Visit | Attending: Gastroenterology | Admitting: Gastroenterology

## 2014-06-12 DIAGNOSIS — Z87891 Personal history of nicotine dependence: Secondary | ICD-10-CM | POA: Diagnosis not present

## 2014-06-12 DIAGNOSIS — R911 Solitary pulmonary nodule: Secondary | ICD-10-CM

## 2014-06-30 DIAGNOSIS — H2511 Age-related nuclear cataract, right eye: Secondary | ICD-10-CM | POA: Diagnosis not present

## 2014-06-30 DIAGNOSIS — H25811 Combined forms of age-related cataract, right eye: Secondary | ICD-10-CM | POA: Diagnosis not present

## 2014-07-02 DIAGNOSIS — H2512 Age-related nuclear cataract, left eye: Secondary | ICD-10-CM | POA: Diagnosis not present

## 2014-07-07 DIAGNOSIS — H268 Other specified cataract: Secondary | ICD-10-CM | POA: Diagnosis not present

## 2014-07-07 DIAGNOSIS — H2512 Age-related nuclear cataract, left eye: Secondary | ICD-10-CM | POA: Diagnosis not present

## 2014-09-01 ENCOUNTER — Other Ambulatory Visit: Payer: Self-pay

## 2014-09-05 DIAGNOSIS — R7309 Other abnormal glucose: Secondary | ICD-10-CM | POA: Diagnosis not present

## 2014-09-05 DIAGNOSIS — E780 Pure hypercholesterolemia: Secondary | ICD-10-CM | POA: Diagnosis not present

## 2014-09-05 DIAGNOSIS — I10 Essential (primary) hypertension: Secondary | ICD-10-CM | POA: Diagnosis not present

## 2014-09-21 ENCOUNTER — Emergency Department (HOSPITAL_COMMUNITY)
Admission: EM | Admit: 2014-09-21 | Discharge: 2014-09-21 | Disposition: A | Discharge status: Home / Self Care | Payer: Medicare Other | Attending: Emergency Medicine | Admitting: Emergency Medicine

## 2014-09-21 ENCOUNTER — Encounter (HOSPITAL_COMMUNITY): Payer: Self-pay

## 2014-09-21 DIAGNOSIS — M1990 Unspecified osteoarthritis, unspecified site: Secondary | ICD-10-CM | POA: Insufficient documentation

## 2014-09-21 DIAGNOSIS — F329 Major depressive disorder, single episode, unspecified: Secondary | ICD-10-CM | POA: Insufficient documentation

## 2014-09-21 DIAGNOSIS — Y9289 Other specified places as the place of occurrence of the external cause: Secondary | ICD-10-CM | POA: Diagnosis not present

## 2014-09-21 DIAGNOSIS — S0592XA Unspecified injury of left eye and orbit, initial encounter: Secondary | ICD-10-CM | POA: Diagnosis not present

## 2014-09-21 DIAGNOSIS — Z7982 Long term (current) use of aspirin: Secondary | ICD-10-CM | POA: Diagnosis not present

## 2014-09-21 DIAGNOSIS — Z79899 Other long term (current) drug therapy: Secondary | ICD-10-CM | POA: Insufficient documentation

## 2014-09-21 DIAGNOSIS — F419 Anxiety disorder, unspecified: Secondary | ICD-10-CM | POA: Insufficient documentation

## 2014-09-21 DIAGNOSIS — M810 Age-related osteoporosis without current pathological fracture: Secondary | ICD-10-CM | POA: Diagnosis not present

## 2014-09-21 DIAGNOSIS — Y9389 Activity, other specified: Secondary | ICD-10-CM | POA: Diagnosis not present

## 2014-09-21 DIAGNOSIS — F42 Obsessive-compulsive disorder: Secondary | ICD-10-CM | POA: Diagnosis not present

## 2014-09-21 DIAGNOSIS — W228XXA Striking against or struck by other objects, initial encounter: Secondary | ICD-10-CM | POA: Insufficient documentation

## 2014-09-21 DIAGNOSIS — Y998 Other external cause status: Secondary | ICD-10-CM | POA: Diagnosis not present

## 2014-09-21 DIAGNOSIS — Z8669 Personal history of other diseases of the nervous system and sense organs: Secondary | ICD-10-CM | POA: Diagnosis not present

## 2014-09-21 DIAGNOSIS — Z87891 Personal history of nicotine dependence: Secondary | ICD-10-CM | POA: Diagnosis not present

## 2014-09-21 DIAGNOSIS — I10 Essential (primary) hypertension: Secondary | ICD-10-CM | POA: Insufficient documentation

## 2014-09-21 DIAGNOSIS — E785 Hyperlipidemia, unspecified: Secondary | ICD-10-CM | POA: Insufficient documentation

## 2014-09-21 MED ORDER — FLUORESCEIN SODIUM 1 MG OP STRP
1.0000 | ORAL_STRIP | Freq: Once | OPHTHALMIC | Status: AC
  Administered 2014-09-21: 1 via OPHTHALMIC
  Filled 2014-09-21: qty 1

## 2014-09-21 MED ORDER — ERYTHROMYCIN 5 MG/GM OP OINT
TOPICAL_OINTMENT | Freq: Four times a day (QID) | OPHTHALMIC | Status: DC
  Administered 2014-09-21: 16:00:00 via OPHTHALMIC
  Filled 2014-09-21: qty 3.5

## 2014-09-21 MED ORDER — TETRACAINE HCL 0.5 % OP SOLN
2.0000 [drp] | Freq: Once | OPHTHALMIC | Status: AC
  Administered 2014-09-21: 2 [drp] via OPHTHALMIC
  Filled 2014-09-21: qty 2

## 2014-09-21 NOTE — ED Notes (Signed)
She states that as she was digging out a garden bush a branch from it struck her left eye.  She  Has slight abrasion on medial lower left eyelid and her left sclera is erythematous.

## 2014-09-21 NOTE — ED Provider Notes (Signed)
CSN: [id number]     Arrival date & time 09/21/14  1454 History  This chart was scribed for Elpidio Anis, PA-C, working with Lorre Nick, MD by Elon Spanner, ED Scribe. This patient was seen in room WTR6/WTR6 and the patient's care was started at 3:05 PM.   No chief complaint on file.  The history is provided by the patient. No language interpreter was used.   HPI Comments: Selena Morris is a 69 y.o. female who presents to the Emergency Department complaining of left eye pain onset PTA.  She reports associated eye redness, clear discharge, and a sensation of a small foreign body.  The patient reports she was using a shovel to uproot a butterfly bush when the plant rebounded and hit her eye.  She has not tried anything for this complaint.  Patient reports a history of cataract surgery >1 month ago.  She denies vision changes, photophobia, headache.     Past Medical History  Diagnosis Date  . Osteoporosis   . Hyperlipemia   . Depression   . Anxiety   . Conjunctivitis   . Hypertension     no bp meds last year  . ADD (attention deficit disorder)   . OCD (obsessive compulsive disorder)   . Arthritis    Past Surgical History  Procedure Laterality Date  . Abdominal hysterectomy    . Colonoscopy with propofol N/A 06/02/2014    Procedure: COLONOSCOPY WITH PROPOFOL;  Surgeon: Charolett Bumpers, MD;  Location: [facility];  Service: Endoscopy;  Laterality: N/A;   Family History  Problem Relation Age of Onset  . Family history unknown: Yes   History  Substance Use Topics  . Smoking status: Former Smoker -- 1.00 packs/day for 20 years    Types: Cigarettes    Quit date: 03/07/1984  . Smokeless tobacco: Never Used  . Alcohol Use: 12.6 oz/week    21 Glasses of wine per week     Comment: 2 glasses wine per day   OB History    No data available     Review of Systems  Eyes: Positive for pain. Negative for photophobia and visual disturbance.  Neurological: Negative for headaches.       Allergies  Tylox  Home Medications   Prior to Admission medications   Medication Sig Start Date End Date Taking? Authorizing Provider  Alpha Lipoic Acid 200 MG CAPS Take 1 capsule by mouth every morning.    Historical Provider, MD  aspirin EC 81 MG tablet Take 81 mg by mouth every morning.     Historical Provider, MD  b complex vitamins tablet Take 1 tablet by mouth every morning.     Historical Provider, MD  calcium-vitamin D (OSCAL WITH D) 500-200 MG-UNIT per tablet Take 1 tablet by mouth every morning.     Historical Provider, MD  cholecalciferol (VITAMIN D) 1000 UNITS tablet Take 1,000 Units by mouth every morning.     Historical Provider, MD  clonazePAM (KLONOPIN) 0.5 MG tablet Take 0.5 mg by mouth every morning.     Historical Provider, MD  Coenzyme Q10 (COQ-10 PO) Take 500 mg by mouth every morning.     Historical Provider, MD  escitalopram (LEXAPRO) 20 MG tablet Take 10 mg by mouth every evening.     Historical Provider, MD  fish oil-omega-3 fatty acids 1000 MG capsule Take 2 g by mouth every morning.     Historical Provider, MD  HYDROcodone-acetaminophen (NORCO/VICODIN) 5-325 MG per tablet Take 2 tablets by mouth  every 4 (four) hours as needed for pain. Patient not taking: Reported on 05/12/2014 01/22/12   Glynn OctaveStephen Rancour, MD  ibuprofen (ADVIL,MOTRIN) 600 MG tablet Take 1 tablet (600 mg total) by mouth every 6 (six) hours as needed for pain. Patient not taking: Reported on 05/12/2014 01/22/12   Glynn OctaveStephen Rancour, MD  Nutritional Supplements (ESTROVEN ENERGY PO) Take 1 tablet by mouth every morning.     Historical Provider, MD  OVER THE COUNTER MEDICATION Take 1 tablet by mouth every morning. [employer] brand allergy    Historical Provider, MD  Polyethyl Glycol-Propyl Glycol (SYSTANE OP) Apply 1-2 drops to eye daily as needed (dry eyes, dust.).    Historical Provider, MD  pravastatin (PRAVACHOL) 80 MG tablet Take 80 mg by mouth every evening.     Historical Provider, MD   There were  no vitals taken for this visit. Physical Exam  Constitutional: She is oriented to person, place, and time. She appears well-developed and well-nourished. No distress.  HENT:  Head: Normocephalic and atraumatic.  Eyes: Conjunctivae and EOM are normal. Pupils are equal, round, and reactive to light.  No observed foreign body. Small medial subconjunctival injection. No hyphema. FROM. Fluorescein staining negative for abrasion. No swelling of eye lids.   Neck: Neck supple. No tracheal deviation present.  Cardiovascular: Normal rate.   Pulmonary/Chest: Effort normal. No respiratory distress.  Musculoskeletal: Normal range of motion.  Neurological: She is alert and oriented to person, place, and time.  Skin: Skin is warm and dry.  Psychiatric: She has a normal mood and affect. Her behavior is normal.  Nursing note and vitals reviewed.   ED Course  Procedures (including critical care time)  DIAGNOSTIC STUDIES: Oxygen Saturation is 96% on RA, adequate by my interpretation.    COORDINATION OF CARE:  3:07 PM Discussed treatment plan with patient at bedside.  Patient acknowledges and agrees with plan.    Labs Review Labs Reviewed - No data to display  Imaging Review No results found.   EKG Interpretation None      MDM   Final diagnoses:  None    1. Left eye injury  No obvious intraocular injury. Will refer to her ophthalmologist for any new symptoms of concern.   I personally performed the services described in this documentation, which was scribed in my presence. The recorded information has been reviewed and is accurate.     Elpidio AnisShari Antwonette Feliz, PA-C 09/26/14 2344  Lorre NickAnthony Allen, MD 09/30/14 1024

## 2014-09-21 NOTE — Discharge Instructions (Signed)
FOLLOW UP WITH DR. Dione BoozeGROAT FOR RECHECK IN 2-3 DAYS. RETURN HERE WITH ANY WORSENING SYMPTOMS OR NEW CONCERNS.

## 2014-11-07 DIAGNOSIS — E780 Pure hypercholesterolemia: Secondary | ICD-10-CM | POA: Diagnosis not present

## 2014-11-07 DIAGNOSIS — R7309 Other abnormal glucose: Secondary | ICD-10-CM | POA: Diagnosis not present

## 2014-11-13 DIAGNOSIS — E780 Pure hypercholesterolemia: Secondary | ICD-10-CM | POA: Diagnosis not present

## 2014-11-13 DIAGNOSIS — Z23 Encounter for immunization: Secondary | ICD-10-CM | POA: Diagnosis not present

## 2014-11-13 DIAGNOSIS — R7309 Other abnormal glucose: Secondary | ICD-10-CM | POA: Diagnosis not present

## 2014-11-13 DIAGNOSIS — I10 Essential (primary) hypertension: Secondary | ICD-10-CM | POA: Diagnosis not present

## 2014-11-13 DIAGNOSIS — K7689 Other specified diseases of liver: Secondary | ICD-10-CM | POA: Diagnosis not present

## 2014-12-24 DIAGNOSIS — I10 Essential (primary) hypertension: Secondary | ICD-10-CM | POA: Diagnosis not present

## 2014-12-24 DIAGNOSIS — R5383 Other fatigue: Secondary | ICD-10-CM | POA: Diagnosis not present

## 2014-12-24 DIAGNOSIS — K7689 Other specified diseases of liver: Secondary | ICD-10-CM | POA: Diagnosis not present

## 2015-01-01 DIAGNOSIS — K7689 Other specified diseases of liver: Secondary | ICD-10-CM | POA: Diagnosis not present

## 2015-01-01 DIAGNOSIS — I10 Essential (primary) hypertension: Secondary | ICD-10-CM | POA: Diagnosis not present

## 2015-01-01 DIAGNOSIS — E7800 Pure hypercholesterolemia, unspecified: Secondary | ICD-10-CM | POA: Diagnosis not present

## 2015-01-27 DIAGNOSIS — Z961 Presence of intraocular lens: Secondary | ICD-10-CM | POA: Diagnosis not present

## 2015-01-27 DIAGNOSIS — E119 Type 2 diabetes mellitus without complications: Secondary | ICD-10-CM | POA: Diagnosis not present

## 2015-01-27 DIAGNOSIS — H35372 Puckering of macula, left eye: Secondary | ICD-10-CM | POA: Diagnosis not present

## 2015-04-01 DIAGNOSIS — E7800 Pure hypercholesterolemia, unspecified: Secondary | ICD-10-CM | POA: Diagnosis not present

## 2015-04-06 DIAGNOSIS — I10 Essential (primary) hypertension: Secondary | ICD-10-CM | POA: Diagnosis not present

## 2015-04-06 DIAGNOSIS — R7309 Other abnormal glucose: Secondary | ICD-10-CM | POA: Diagnosis not present

## 2015-04-06 DIAGNOSIS — M8580 Other specified disorders of bone density and structure, unspecified site: Secondary | ICD-10-CM | POA: Diagnosis not present

## 2015-04-06 DIAGNOSIS — E7800 Pure hypercholesterolemia, unspecified: Secondary | ICD-10-CM | POA: Diagnosis not present

## 2015-04-10 ENCOUNTER — Other Ambulatory Visit: Payer: Self-pay

## 2015-04-10 DIAGNOSIS — Z1231 Encounter for screening mammogram for malignant neoplasm of breast: Secondary | ICD-10-CM

## 2015-04-28 ENCOUNTER — Ambulatory Visit
Admission: RE | Admit: 2015-04-28 | Discharge: 2015-04-28 | Disposition: A | Discharge status: Home / Self Care | Payer: Medicare Other | Source: Ambulatory Visit

## 2015-04-28 DIAGNOSIS — Z1231 Encounter for screening mammogram for malignant neoplasm of breast: Secondary | ICD-10-CM | POA: Diagnosis not present

## 2015-06-29 DIAGNOSIS — M8580 Other specified disorders of bone density and structure, unspecified site: Secondary | ICD-10-CM | POA: Diagnosis not present

## 2015-06-29 DIAGNOSIS — E7800 Pure hypercholesterolemia, unspecified: Secondary | ICD-10-CM | POA: Diagnosis not present

## 2015-06-29 DIAGNOSIS — I10 Essential (primary) hypertension: Secondary | ICD-10-CM | POA: Diagnosis not present

## 2015-06-29 DIAGNOSIS — R7309 Other abnormal glucose: Secondary | ICD-10-CM | POA: Diagnosis not present

## 2015-06-29 DIAGNOSIS — E559 Vitamin D deficiency, unspecified: Secondary | ICD-10-CM | POA: Diagnosis not present

## 2015-07-06 DIAGNOSIS — R7309 Other abnormal glucose: Secondary | ICD-10-CM | POA: Diagnosis not present

## 2015-07-06 DIAGNOSIS — E7800 Pure hypercholesterolemia, unspecified: Secondary | ICD-10-CM | POA: Diagnosis not present

## 2015-07-06 DIAGNOSIS — I10 Essential (primary) hypertension: Secondary | ICD-10-CM | POA: Diagnosis not present

## 2015-11-03 DIAGNOSIS — Z131 Encounter for screening for diabetes mellitus: Secondary | ICD-10-CM | POA: Diagnosis not present

## 2015-11-03 DIAGNOSIS — I10 Essential (primary) hypertension: Secondary | ICD-10-CM | POA: Diagnosis not present

## 2015-11-03 DIAGNOSIS — R7309 Other abnormal glucose: Secondary | ICD-10-CM | POA: Diagnosis not present

## 2015-11-05 ENCOUNTER — Emergency Department (HOSPITAL_COMMUNITY)
Admission: EM | Admit: 2015-11-05 | Discharge: 2015-11-05 | Disposition: A | Discharge status: Home / Self Care | Payer: Medicare Other | Attending: Emergency Medicine | Admitting: Emergency Medicine

## 2015-11-05 ENCOUNTER — Encounter (HOSPITAL_COMMUNITY): Payer: Self-pay | Admitting: Emergency Medicine

## 2015-11-05 ENCOUNTER — Emergency Department (HOSPITAL_COMMUNITY): Payer: Medicare Other

## 2015-11-05 DIAGNOSIS — F909 Attention-deficit hyperactivity disorder, unspecified type: Secondary | ICD-10-CM | POA: Insufficient documentation

## 2015-11-05 DIAGNOSIS — Z79899 Other long term (current) drug therapy: Secondary | ICD-10-CM | POA: Insufficient documentation

## 2015-11-05 DIAGNOSIS — Z87891 Personal history of nicotine dependence: Secondary | ICD-10-CM | POA: Diagnosis not present

## 2015-11-05 DIAGNOSIS — R0789 Other chest pain: Secondary | ICD-10-CM | POA: Diagnosis not present

## 2015-11-05 DIAGNOSIS — Z7982 Long term (current) use of aspirin: Secondary | ICD-10-CM | POA: Insufficient documentation

## 2015-11-05 DIAGNOSIS — I10 Essential (primary) hypertension: Secondary | ICD-10-CM | POA: Insufficient documentation

## 2015-11-05 DIAGNOSIS — R079 Chest pain, unspecified: Secondary | ICD-10-CM | POA: Diagnosis not present

## 2015-11-05 LAB — BASIC METABOLIC PANEL
ANION GAP: 7 (ref 5–15)
BUN: 23 mg/dL — ABNORMAL HIGH (ref 6–20)
CO2: 27 mmol/L (ref 22–32)
Calcium: 9.9 mg/dL (ref 8.9–10.3)
Chloride: 103 mmol/L (ref 101–111)
Creatinine, Ser: 0.77 mg/dL (ref 0.44–1.00)
GFR calc non Af Amer: >60 mL/min (ref >60)
Glucose, Bld: 102 mg/dL — ABNORMAL HIGH (ref 65–99)
Potassium: 3.9 mmol/L (ref 3.5–5.1)
SODIUM: 137 mmol/L (ref 135–145)

## 2015-11-05 LAB — CBC
HCT: 44.3 % (ref 36.0–46.0)
HEMOGLOBIN: 14.7 g/dL (ref 12.0–15.0)
MCH: 33.1 pg (ref 26.0–34.0)
MCHC: 33.2 g/dL (ref 30.0–36.0)
MCV: 99.8 fL (ref 78.0–100.0)
PLATELETS: 229 10*3/uL (ref 150–400)
RBC: 4.44 MIL/uL (ref 3.87–5.11)
RDW: 13.4 % (ref 11.5–15.5)
WBC: 9.2 10*3/uL (ref 4.0–10.5)

## 2015-11-05 LAB — I-STAT TROPONIN, ED: TROPONIN I, POC: 0 ng/mL (ref 0.00–0.08)

## 2015-11-05 NOTE — ED Triage Notes (Signed)
Patient states cp started about 1045 this morning. Denies N/V, SOB, diaphoresis. Patient describes pain as "discomfort". Denies hx of same.

## 2015-11-05 NOTE — ED Provider Notes (Signed)
[facility]-EMERGENCY DEPT Provider Note   CSN: [id number] Arrival date & time: 11/05/15  1231     History   Chief Complaint Chief Complaint  Patient presents with  . Chest Pain    HPI Selena Morris is a 70 y.o. female.   She presents for evaluation of a 10 minute period of chest tightness, in the center of the chest. At the time of the discomfort. She was feeling stressed and upset, while she was reviewing some information on the Internet. She had been looking up information about the dangers of smoking around pregnant women, relative to the health of the fetus. She has an acquaintance who meets that concern, for her. She did not have any associated diaphoresis, nausea, vomiting, weakness or dizziness. She has not had recurrence. Her prior similar episode. She is otherwise well. She took her usual morning medications today, but has not anemia. There are no other no modifying factors.   HPI  Past Medical History:  Diagnosis Date  . ADD (attention deficit disorder)   . Anxiety   . Arthritis   . Conjunctivitis   . Depression   . Hyperlipemia   . Hypertension    no bp meds last year  . OCD (obsessive compulsive disorder)   . Osteoporosis     There are no active problems to display for this patient.   Past Surgical History:  Procedure Laterality Date  . ABDOMINAL HYSTERECTOMY    . COLONOSCOPY WITH PROPOFOL N/A 06/02/2014   Procedure: COLONOSCOPY WITH PROPOFOL;  Surgeon: Charolett BumpersMartin K Johnson, MD;  Location: [facility] ENDOSCOPY;  Service: Endoscopy;  Laterality: N/A;    OB History    No data available       Home Medications    Prior to Admission medications   Medication Sig Start Date End Date Taking? Authorizing Provider  Alpha Lipoic Acid 200 MG CAPS Take 1 capsule by mouth every morning.   Yes Historical Provider, MD  aspirin EC 81 MG tablet Take 81 mg by mouth every morning.    Yes Historical Provider, MD  b complex vitamins tablet Take 1 tablet by mouth every morning.     Yes Historical Provider, MD  calcium-vitamin D (OSCAL WITH D) 500-200 MG-UNIT per tablet Take 1 tablet by mouth every morning.    Yes Historical Provider, MD  cholecalciferol (VITAMIN D) 1000 UNITS tablet Take 1,000 Units by mouth every morning.    Yes Historical Provider, MD  Coenzyme Q10 (COQ-10 PO) Take 500 mg by mouth every morning.    Yes Historical Provider, MD  escitalopram (LEXAPRO) 20 MG tablet Take 10 mg by mouth every evening.    Yes Historical Provider, MD  fish oil-omega-3 fatty acids 1000 MG capsule Take 1 g by mouth every morning.    Yes Historical Provider, MD  Nutritional Supplements (ESTROVEN ENERGY PO) Take 1 tablet by mouth every morning.    Yes Historical Provider, MD  OVER THE COUNTER MEDICATION Take 1 tablet by mouth every morning. [employer] brand allergy   Yes Historical Provider, MD  OVER THE COUNTER MEDICATION Take 1 capsule by mouth daily. Buffered C ph control   Yes Historical Provider, MD  Polyethyl Glycol-Propyl Glycol (SYSTANE OP) Apply 1-2 drops to eye daily as needed (dry eyes, dust.).   Yes Historical Provider, MD  simvastatin (ZOCOR) 10 MG tablet Take 10 mg by mouth daily at 6 PM.   Yes Historical Provider, MD  HYDROcodone-acetaminophen (NORCO/VICODIN) 5-325 MG per tablet Take 2 tablets by mouth every 4 (  four) hours as needed for pain. Patient not taking: Reported on 05/12/2014 01/22/12   Glynn Octave, MD  ibuprofen (ADVIL,MOTRIN) 600 MG tablet Take 1 tablet (600 mg total) by mouth every 6 (six) hours as needed for pain. Patient not taking: Reported on 05/12/2014 01/22/12   Glynn Octave, MD    Family History Family History  Problem Relation Age of Onset  . Family history unknown: Yes    Social History Social History  Substance Use Topics  . Smoking status: Former Smoker    Packs/day: 1.00    Years: 20.00    Types: Cigarettes    Quit date: 03/07/1984  . Smokeless tobacco: Never Used  . Alcohol use 12.6 oz/week    21 Glasses of wine per week      Comment: 2 glasses wine per day     Allergies   Percocet [oxycodone-acetaminophen] and Tylox [oxycodone-acetaminophen]   Review of Systems Review of Systems  All other systems reviewed and are negative.    Physical Exam Updated Vital Signs BP (!) 173/102 (BP Location: Right Arm)   Pulse (!) 58   Temp 98.3 F (36.8 C) (Oral)   Resp 16   Ht 5' (1.524 m)   Wt 130 lb (59 kg)   SpO2 100%   BMI 25.39 kg/m   Physical Exam  Constitutional: She is oriented to person, place, and time. She appears well-developed and well-nourished.  HENT:  Head: Normocephalic and atraumatic.  Eyes: Conjunctivae and EOM are normal. Pupils are equal, round, and reactive to light.  Neck: Normal range of motion and phonation normal. Neck supple.  Cardiovascular: Normal rate and regular rhythm.   Pulmonary/Chest: Effort normal and breath sounds normal. She exhibits no tenderness.  Abdominal: Soft. She exhibits no distension. There is no tenderness. There is no guarding.  Musculoskeletal: Normal range of motion. She exhibits no edema or tenderness.  Neurological: She is alert and oriented to person, place, and time. She exhibits normal muscle tone.  Skin: Skin is warm and dry.  Psychiatric: She has a normal mood and affect. Her behavior is normal. Judgment and thought content normal.  Nursing note and vitals reviewed.    ED Treatments / Results  Labs (all labs ordered are listed, but only abnormal results are displayed) Labs Reviewed  BASIC METABOLIC PANEL - Abnormal; Notable for the following:       Result Value   Glucose, Bld 102 (*)    BUN 23 (*)    All other components within normal limits  CBC  I-STAT TROPOININ, ED    EKG  EKG Interpretation  Date/Time:  Thursday November 05 2015 12:36:56 EDT Ventricular Rate:  59 PR Interval:    QRS Duration: 99 QT Interval:  404 QTC Calculation: 401 R Axis:   45 Text Interpretation:  Sinus rhythm Borderline T abnormalities, anterior leads  Baseline wander in lead(s) II aVR aVF V6 Since last tracing Non-specific ST-t changes are new Confirmed by Effie Shy  MD, Agam Davenport [phone]) on 11/05/2015 1:27:43 PM       Radiology Dg Chest 2 View  Result Date: 11/05/2015 CLINICAL DATA:  Chest pain beginning at 10:45 this morning. EXAM: CHEST  2 VIEW COMPARISON:  CT chest 06/12/2014. Single-view of the chest 01/22/2012. FINDINGS: The lungs are clear. Heart size is normal. No pneumothorax or pleural effusion. Thoracic spondylosis with degenerative endplate spurring and sclerosis at T9-10 is unchanged. IMPRESSION: No active cardiopulmonary disease. Electronically Signed   By: Drusilla Kanner M.D.   On: 11/05/2015 13:24  Procedures Procedures (including critical care time)  Medications Ordered in ED Medications - No data to display   Initial Impression / Assessment and Plan / ED Course  I have reviewed the triage vital signs and the nursing notes.  Pertinent labs & imaging results that were available during my care of the patient were reviewed by me and considered in my medical decision making (see chart for details).  Clinical Course   Medications - No data to display  Patient Vitals for the past 24 hrs:  BP Temp Temp src Pulse Resp SpO2 Height Weight  11/05/15 1247 - - - - - - 5' (1.524 m) 130 lb (59 kg)  11/05/15 1237 (!) 173/102 98.3 F (36.8 C) Oral (!) 58 16 100 % - -    2:27 PM Reevaluation with update and discussion. After initial assessment and treatment, an updated evaluation reveals No change in clinical status. Findings discussed with the patient and all questions were answered. Aniyha Tate L      Final Clinical Impressions(s) / ED Diagnoses   Final diagnoses:  Nonspecific chest pain    Noncardiac chest pain, with reassuring evaluation. Pain is stress related and unlikely to represent an acute ischemic or infarct pattern. Patient is a low risk for cardiac disease.  Nursing Notes Reviewed/ Care  Coordinated Applicable Imaging Reviewed Interpretation of Laboratory Data incorporated into ED treatment  The patient appears reasonably screened and/or stabilized for discharge and I doubt any other medical condition or other [facility] requiring further screening, evaluation, or treatment in the ED at this time prior to discharge.  Plan: Home Medications- continue; Home Treatments- rest; return here if the recommended treatment, does not improve the symptoms; Recommended follow up- PCP 4-5 days for check up   New Prescriptions New Prescriptions   No medications on file     Mancel Bale, MD 11/05/15 1428

## 2015-11-05 NOTE — ED Notes (Signed)
Pt tx'd to x-ray

## 2015-11-05 NOTE — Discharge Instructions (Signed)
The tests today, are reassuring.  Your EKG was subtly different, without evidence for heart attack today.  Try to avoid stressful situations.  Discuss further evaluation and treatments, with your primary care doctor when you see him next week.

## 2015-11-16 DIAGNOSIS — E785 Hyperlipidemia, unspecified: Secondary | ICD-10-CM | POA: Diagnosis not present

## 2015-11-16 DIAGNOSIS — R079 Chest pain, unspecified: Secondary | ICD-10-CM | POA: Diagnosis not present

## 2015-12-04 DIAGNOSIS — R0789 Other chest pain: Secondary | ICD-10-CM | POA: Diagnosis not present

## 2015-12-04 DIAGNOSIS — I10 Essential (primary) hypertension: Secondary | ICD-10-CM | POA: Diagnosis not present

## 2015-12-04 DIAGNOSIS — E7800 Pure hypercholesterolemia, unspecified: Secondary | ICD-10-CM | POA: Diagnosis not present

## 2015-12-04 DIAGNOSIS — F419 Anxiety disorder, unspecified: Secondary | ICD-10-CM | POA: Diagnosis not present

## 2016-01-13 DIAGNOSIS — F419 Anxiety disorder, unspecified: Secondary | ICD-10-CM | POA: Diagnosis not present

## 2016-01-13 DIAGNOSIS — E7800 Pure hypercholesterolemia, unspecified: Secondary | ICD-10-CM | POA: Diagnosis not present

## 2016-01-13 DIAGNOSIS — I10 Essential (primary) hypertension: Secondary | ICD-10-CM | POA: Diagnosis not present

## 2016-01-27 DIAGNOSIS — H35373 Puckering of macula, bilateral: Secondary | ICD-10-CM | POA: Diagnosis not present

## 2016-01-27 DIAGNOSIS — H35352 Cystoid macular degeneration, left eye: Secondary | ICD-10-CM | POA: Diagnosis not present

## 2016-01-27 DIAGNOSIS — Z961 Presence of intraocular lens: Secondary | ICD-10-CM | POA: Diagnosis not present

## 2016-01-27 DIAGNOSIS — E119 Type 2 diabetes mellitus without complications: Secondary | ICD-10-CM | POA: Diagnosis not present

## 2016-02-01 DIAGNOSIS — I10 Essential (primary) hypertension: Secondary | ICD-10-CM | POA: Diagnosis not present

## 2016-02-01 DIAGNOSIS — E7800 Pure hypercholesterolemia, unspecified: Secondary | ICD-10-CM | POA: Diagnosis not present

## 2016-02-01 DIAGNOSIS — R0789 Other chest pain: Secondary | ICD-10-CM | POA: Diagnosis not present

## 2016-02-08 DIAGNOSIS — E785 Hyperlipidemia, unspecified: Secondary | ICD-10-CM | POA: Diagnosis not present

## 2016-02-15 DIAGNOSIS — I10 Essential (primary) hypertension: Secondary | ICD-10-CM | POA: Diagnosis not present

## 2016-02-15 DIAGNOSIS — Z0000 Encounter for general adult medical examination without abnormal findings: Secondary | ICD-10-CM | POA: Diagnosis not present

## 2016-02-15 DIAGNOSIS — E7800 Pure hypercholesterolemia, unspecified: Secondary | ICD-10-CM | POA: Diagnosis not present

## 2016-02-15 DIAGNOSIS — R7309 Other abnormal glucose: Secondary | ICD-10-CM | POA: Diagnosis not present

## 2016-04-20 ENCOUNTER — Other Ambulatory Visit: Payer: Self-pay | Admitting: Internal Medicine

## 2016-04-20 DIAGNOSIS — E7800 Pure hypercholesterolemia, unspecified: Secondary | ICD-10-CM | POA: Diagnosis not present

## 2016-04-20 DIAGNOSIS — Z1231 Encounter for screening mammogram for malignant neoplasm of breast: Secondary | ICD-10-CM

## 2016-04-20 DIAGNOSIS — Z961 Presence of intraocular lens: Secondary | ICD-10-CM | POA: Diagnosis not present

## 2016-04-20 DIAGNOSIS — H04123 Dry eye syndrome of bilateral lacrimal glands: Secondary | ICD-10-CM | POA: Diagnosis not present

## 2016-04-20 DIAGNOSIS — I10 Essential (primary) hypertension: Secondary | ICD-10-CM | POA: Diagnosis not present

## 2016-04-20 DIAGNOSIS — F419 Anxiety disorder, unspecified: Secondary | ICD-10-CM | POA: Diagnosis not present

## 2016-04-20 DIAGNOSIS — Z136 Encounter for screening for cardiovascular disorders: Secondary | ICD-10-CM | POA: Diagnosis not present

## 2016-05-09 ENCOUNTER — Ambulatory Visit: Payer: Medicare Other

## 2016-06-06 ENCOUNTER — Ambulatory Visit
Admission: RE | Admit: 2016-06-06 | Discharge: 2016-06-06 | Disposition: A | Discharge status: Home / Self Care | Payer: Medicare Other | Source: Ambulatory Visit | Attending: Internal Medicine | Admitting: Internal Medicine

## 2016-06-06 DIAGNOSIS — Z1231 Encounter for screening mammogram for malignant neoplasm of breast: Secondary | ICD-10-CM | POA: Diagnosis not present

## 2016-06-06 DIAGNOSIS — Z0000 Encounter for general adult medical examination without abnormal findings: Secondary | ICD-10-CM | POA: Diagnosis not present

## 2016-06-06 DIAGNOSIS — I10 Essential (primary) hypertension: Secondary | ICD-10-CM | POA: Diagnosis not present

## 2016-06-06 DIAGNOSIS — R7309 Other abnormal glucose: Secondary | ICD-10-CM | POA: Diagnosis not present

## 2016-06-06 DIAGNOSIS — E7800 Pure hypercholesterolemia, unspecified: Secondary | ICD-10-CM | POA: Diagnosis not present

## 2016-07-08 DIAGNOSIS — R7309 Other abnormal glucose: Secondary | ICD-10-CM | POA: Diagnosis not present

## 2016-07-08 DIAGNOSIS — I10 Essential (primary) hypertension: Secondary | ICD-10-CM | POA: Diagnosis not present

## 2016-07-08 DIAGNOSIS — M8580 Other specified disorders of bone density and structure, unspecified site: Secondary | ICD-10-CM | POA: Diagnosis not present

## 2016-07-08 DIAGNOSIS — M859 Disorder of bone density and structure, unspecified: Secondary | ICD-10-CM | POA: Diagnosis not present

## 2016-07-15 DIAGNOSIS — E7800 Pure hypercholesterolemia, unspecified: Secondary | ICD-10-CM | POA: Diagnosis not present

## 2016-07-15 DIAGNOSIS — Z0000 Encounter for general adult medical examination without abnormal findings: Secondary | ICD-10-CM | POA: Diagnosis not present

## 2016-07-15 DIAGNOSIS — I10 Essential (primary) hypertension: Secondary | ICD-10-CM | POA: Diagnosis not present

## 2016-07-15 DIAGNOSIS — R7309 Other abnormal glucose: Secondary | ICD-10-CM | POA: Diagnosis not present

## 2016-11-25 DIAGNOSIS — Z23 Encounter for immunization: Secondary | ICD-10-CM | POA: Diagnosis not present

## 2017-01-09 DIAGNOSIS — R7309 Other abnormal glucose: Secondary | ICD-10-CM | POA: Diagnosis not present

## 2017-01-09 DIAGNOSIS — I10 Essential (primary) hypertension: Secondary | ICD-10-CM | POA: Diagnosis not present

## 2017-01-16 DIAGNOSIS — Z0000 Encounter for general adult medical examination without abnormal findings: Secondary | ICD-10-CM | POA: Diagnosis not present

## 2017-01-16 DIAGNOSIS — I10 Essential (primary) hypertension: Secondary | ICD-10-CM | POA: Diagnosis not present

## 2017-01-16 DIAGNOSIS — E7800 Pure hypercholesterolemia, unspecified: Secondary | ICD-10-CM | POA: Diagnosis not present

## 2017-01-16 DIAGNOSIS — R7309 Other abnormal glucose: Secondary | ICD-10-CM | POA: Diagnosis not present

## 2017-02-08 DIAGNOSIS — E119 Type 2 diabetes mellitus without complications: Secondary | ICD-10-CM | POA: Diagnosis not present

## 2017-02-08 DIAGNOSIS — H04123 Dry eye syndrome of bilateral lacrimal glands: Secondary | ICD-10-CM | POA: Diagnosis not present

## 2017-02-08 DIAGNOSIS — H35373 Puckering of macula, bilateral: Secondary | ICD-10-CM | POA: Diagnosis not present

## 2017-02-08 DIAGNOSIS — Z961 Presence of intraocular lens: Secondary | ICD-10-CM | POA: Diagnosis not present

## 2017-07-20 DIAGNOSIS — E559 Vitamin D deficiency, unspecified: Secondary | ICD-10-CM | POA: Diagnosis not present

## 2017-07-20 DIAGNOSIS — I10 Essential (primary) hypertension: Secondary | ICD-10-CM | POA: Diagnosis not present

## 2017-07-20 DIAGNOSIS — E7800 Pure hypercholesterolemia, unspecified: Secondary | ICD-10-CM | POA: Diagnosis not present

## 2017-07-24 ENCOUNTER — Other Ambulatory Visit: Payer: Self-pay | Admitting: Internal Medicine

## 2017-07-24 DIAGNOSIS — Z1231 Encounter for screening mammogram for malignant neoplasm of breast: Secondary | ICD-10-CM

## 2017-07-27 ENCOUNTER — Ambulatory Visit
Admission: RE | Admit: 2017-07-27 | Discharge: 2017-07-27 | Disposition: A | Discharge status: Home / Self Care | Payer: Medicare Other | Source: Ambulatory Visit | Attending: Internal Medicine | Admitting: Internal Medicine

## 2017-07-27 DIAGNOSIS — M810 Age-related osteoporosis without current pathological fracture: Secondary | ICD-10-CM | POA: Diagnosis not present

## 2017-07-27 DIAGNOSIS — Z0000 Encounter for general adult medical examination without abnormal findings: Secondary | ICD-10-CM | POA: Diagnosis not present

## 2017-07-27 DIAGNOSIS — I10 Essential (primary) hypertension: Secondary | ICD-10-CM | POA: Diagnosis not present

## 2017-07-27 DIAGNOSIS — F321 Major depressive disorder, single episode, moderate: Secondary | ICD-10-CM | POA: Diagnosis not present

## 2017-07-27 DIAGNOSIS — Z1231 Encounter for screening mammogram for malignant neoplasm of breast: Secondary | ICD-10-CM

## 2017-07-27 DIAGNOSIS — R252 Cramp and spasm: Secondary | ICD-10-CM | POA: Diagnosis not present

## 2017-09-06 DIAGNOSIS — I10 Essential (primary) hypertension: Secondary | ICD-10-CM | POA: Diagnosis not present

## 2017-09-06 DIAGNOSIS — E7800 Pure hypercholesterolemia, unspecified: Secondary | ICD-10-CM | POA: Diagnosis not present

## 2017-09-06 DIAGNOSIS — F411 Generalized anxiety disorder: Secondary | ICD-10-CM | POA: Diagnosis not present

## 2017-09-06 DIAGNOSIS — R7309 Other abnormal glucose: Secondary | ICD-10-CM | POA: Diagnosis not present

## 2017-12-08 DIAGNOSIS — Z23 Encounter for immunization: Secondary | ICD-10-CM | POA: Diagnosis not present

## 2018-03-05 DIAGNOSIS — R7309 Other abnormal glucose: Secondary | ICD-10-CM | POA: Diagnosis not present

## 2018-03-05 DIAGNOSIS — I10 Essential (primary) hypertension: Secondary | ICD-10-CM | POA: Diagnosis not present

## 2018-03-12 DIAGNOSIS — F419 Anxiety disorder, unspecified: Secondary | ICD-10-CM | POA: Diagnosis not present

## 2018-03-12 DIAGNOSIS — E785 Hyperlipidemia, unspecified: Secondary | ICD-10-CM | POA: Diagnosis not present

## 2018-03-12 DIAGNOSIS — I10 Essential (primary) hypertension: Secondary | ICD-10-CM | POA: Diagnosis not present

## 2018-03-12 DIAGNOSIS — R7301 Impaired fasting glucose: Secondary | ICD-10-CM | POA: Diagnosis not present

## 2018-03-14 DIAGNOSIS — E119 Type 2 diabetes mellitus without complications: Secondary | ICD-10-CM | POA: Diagnosis not present

## 2018-03-14 DIAGNOSIS — H35373 Puckering of macula, bilateral: Secondary | ICD-10-CM | POA: Diagnosis not present

## 2018-03-14 DIAGNOSIS — Z961 Presence of intraocular lens: Secondary | ICD-10-CM | POA: Diagnosis not present

## 2018-03-14 DIAGNOSIS — H04123 Dry eye syndrome of bilateral lacrimal glands: Secondary | ICD-10-CM | POA: Diagnosis not present

## 2018-08-01 DIAGNOSIS — R7301 Impaired fasting glucose: Secondary | ICD-10-CM | POA: Diagnosis not present

## 2018-08-01 DIAGNOSIS — I10 Essential (primary) hypertension: Secondary | ICD-10-CM | POA: Diagnosis not present

## 2018-08-01 DIAGNOSIS — E785 Hyperlipidemia, unspecified: Secondary | ICD-10-CM | POA: Diagnosis not present

## 2018-08-01 DIAGNOSIS — Z0000 Encounter for general adult medical examination without abnormal findings: Secondary | ICD-10-CM | POA: Diagnosis not present

## 2018-08-08 DIAGNOSIS — E559 Vitamin D deficiency, unspecified: Secondary | ICD-10-CM | POA: Diagnosis not present

## 2018-08-08 DIAGNOSIS — R7309 Other abnormal glucose: Secondary | ICD-10-CM | POA: Diagnosis not present

## 2018-08-08 DIAGNOSIS — E7800 Pure hypercholesterolemia, unspecified: Secondary | ICD-10-CM | POA: Diagnosis not present

## 2018-08-08 DIAGNOSIS — I10 Essential (primary) hypertension: Secondary | ICD-10-CM | POA: Diagnosis not present

## 2018-08-08 DIAGNOSIS — F321 Major depressive disorder, single episode, moderate: Secondary | ICD-10-CM | POA: Diagnosis not present

## 2018-11-09 DIAGNOSIS — Z23 Encounter for immunization: Secondary | ICD-10-CM | POA: Diagnosis not present

## 2019-01-30 ENCOUNTER — Other Ambulatory Visit: Payer: Self-pay

## 2019-02-06 DIAGNOSIS — E559 Vitamin D deficiency, unspecified: Secondary | ICD-10-CM | POA: Diagnosis not present

## 2019-02-06 DIAGNOSIS — E7800 Pure hypercholesterolemia, unspecified: Secondary | ICD-10-CM | POA: Diagnosis not present

## 2019-02-06 DIAGNOSIS — I10 Essential (primary) hypertension: Secondary | ICD-10-CM | POA: Diagnosis not present

## 2019-02-06 DIAGNOSIS — R7309 Other abnormal glucose: Secondary | ICD-10-CM | POA: Diagnosis not present

## 2019-02-13 DIAGNOSIS — R7309 Other abnormal glucose: Secondary | ICD-10-CM | POA: Diagnosis not present

## 2019-02-13 DIAGNOSIS — E559 Vitamin D deficiency, unspecified: Secondary | ICD-10-CM | POA: Diagnosis not present

## 2019-02-13 DIAGNOSIS — F419 Anxiety disorder, unspecified: Secondary | ICD-10-CM | POA: Diagnosis not present

## 2019-02-13 DIAGNOSIS — E7800 Pure hypercholesterolemia, unspecified: Secondary | ICD-10-CM | POA: Diagnosis not present

## 2019-02-13 DIAGNOSIS — R252 Cramp and spasm: Secondary | ICD-10-CM | POA: Diagnosis not present

## 2019-02-13 DIAGNOSIS — I10 Essential (primary) hypertension: Secondary | ICD-10-CM | POA: Diagnosis not present

## 2019-04-14 ENCOUNTER — Ambulatory Visit: Payer: Medicare Other | Attending: Internal Medicine

## 2019-04-14 DIAGNOSIS — Z23 Encounter for immunization: Secondary | ICD-10-CM

## 2019-04-14 NOTE — Progress Notes (Signed)
   Covid-19 Vaccination Clinic  Name:  Selena Morris    MRN: [id number] DOB: [birth year]  04/14/2019  [name] was observed post Covid-19 immunization for 15 minutes without incidence. She was provided with Vaccine Information Sheet and instruction to access the V-Safe system.   [name] was instructed to call 911 with any severe reactions post vaccine: Marland Kitchen Difficulty breathing  . Swelling of your face and throat  . A fast heartbeat  . A bad rash all over your body  . Dizziness and weakness    Immunizations Administered    Name Date Dose VIS Date Route   Pfizer COVID-19 Vaccine 04/14/2019  4:43 PM 0.3 mL 02/15/2019 Intramuscular   Manufacturer: [employer], [employer]   Lot: [id number]   NDC: [address]-6

## 2019-04-29 ENCOUNTER — Ambulatory Visit: Payer: Medicare Other

## 2019-05-09 ENCOUNTER — Ambulatory Visit: Payer: Medicare Other | Attending: Internal Medicine

## 2019-05-09 DIAGNOSIS — Z23 Encounter for immunization: Secondary | ICD-10-CM | POA: Insufficient documentation

## 2019-05-09 NOTE — Progress Notes (Signed)
   Covid-19 Vaccination Clinic  Name:  Selena Morris    MRN: [id number] DOB: 02-Mar-1946  05/09/2019  [name] was observed post Covid-19 immunization for 15 minutes without incident. She was provided with Vaccine Information Sheet and instruction to access the V-Safe system.   [name] was instructed to call 911 with any severe reactions post vaccine: Marland Kitchen Difficulty breathing  . Swelling of face and throat  . A fast heartbeat  . A bad rash all over body  . Dizziness and weakness   Immunizations Administered    Name Date Dose VIS Date Route   Pfizer COVID-19 Vaccine 05/09/2019 12:55 PM 0.3 mL 02/15/2019 Intramuscular   Manufacturer: [employer], [employer]   Lot: [id number]   NDC: [address]-2

## 2019-08-07 DIAGNOSIS — E559 Vitamin D deficiency, unspecified: Secondary | ICD-10-CM | POA: Diagnosis not present

## 2019-08-07 DIAGNOSIS — I10 Essential (primary) hypertension: Secondary | ICD-10-CM | POA: Diagnosis not present

## 2019-08-07 DIAGNOSIS — E7800 Pure hypercholesterolemia, unspecified: Secondary | ICD-10-CM | POA: Diagnosis not present

## 2019-08-07 DIAGNOSIS — R7309 Other abnormal glucose: Secondary | ICD-10-CM | POA: Diagnosis not present

## 2019-11-22 DIAGNOSIS — Z23 Encounter for immunization: Secondary | ICD-10-CM | POA: Diagnosis not present

## 2019-12-15 DIAGNOSIS — Z23 Encounter for immunization: Secondary | ICD-10-CM | POA: Diagnosis not present

## 2020-02-12 DIAGNOSIS — M8580 Other specified disorders of bone density and structure, unspecified site: Secondary | ICD-10-CM | POA: Diagnosis not present

## 2020-02-12 DIAGNOSIS — E559 Vitamin D deficiency, unspecified: Secondary | ICD-10-CM | POA: Diagnosis not present

## 2020-02-12 DIAGNOSIS — I10 Essential (primary) hypertension: Secondary | ICD-10-CM | POA: Diagnosis not present

## 2020-02-12 DIAGNOSIS — E538 Deficiency of other specified B group vitamins: Secondary | ICD-10-CM | POA: Diagnosis not present

## 2020-02-12 DIAGNOSIS — E7800 Pure hypercholesterolemia, unspecified: Secondary | ICD-10-CM | POA: Diagnosis not present

## 2020-02-12 DIAGNOSIS — N1832 Chronic kidney disease, stage 3b: Secondary | ICD-10-CM | POA: Diagnosis not present

## 2020-02-12 DIAGNOSIS — Z79899 Other long term (current) drug therapy: Secondary | ICD-10-CM | POA: Diagnosis not present

## 2020-02-19 DIAGNOSIS — R7301 Impaired fasting glucose: Secondary | ICD-10-CM | POA: Diagnosis not present

## 2020-02-19 DIAGNOSIS — E559 Vitamin D deficiency, unspecified: Secondary | ICD-10-CM | POA: Diagnosis not present

## 2020-02-19 DIAGNOSIS — I10 Essential (primary) hypertension: Secondary | ICD-10-CM | POA: Diagnosis not present

## 2020-02-19 DIAGNOSIS — E785 Hyperlipidemia, unspecified: Secondary | ICD-10-CM | POA: Diagnosis not present

## 2020-02-19 DIAGNOSIS — F419 Anxiety disorder, unspecified: Secondary | ICD-10-CM | POA: Diagnosis not present

## 2020-02-19 DIAGNOSIS — E538 Deficiency of other specified B group vitamins: Secondary | ICD-10-CM | POA: Diagnosis not present

## 2020-02-19 DIAGNOSIS — R946 Abnormal results of thyroid function studies: Secondary | ICD-10-CM | POA: Diagnosis not present

## 2020-08-12 DIAGNOSIS — R7301 Impaired fasting glucose: Secondary | ICD-10-CM | POA: Diagnosis not present

## 2020-08-12 DIAGNOSIS — E785 Hyperlipidemia, unspecified: Secondary | ICD-10-CM | POA: Diagnosis not present

## 2020-08-12 DIAGNOSIS — R946 Abnormal results of thyroid function studies: Secondary | ICD-10-CM | POA: Diagnosis not present

## 2020-08-12 DIAGNOSIS — E538 Deficiency of other specified B group vitamins: Secondary | ICD-10-CM | POA: Diagnosis not present

## 2020-08-12 DIAGNOSIS — I10 Essential (primary) hypertension: Secondary | ICD-10-CM | POA: Diagnosis not present

## 2020-08-12 DIAGNOSIS — E559 Vitamin D deficiency, unspecified: Secondary | ICD-10-CM | POA: Diagnosis not present

## 2020-08-20 DIAGNOSIS — E785 Hyperlipidemia, unspecified: Secondary | ICD-10-CM | POA: Diagnosis not present

## 2020-08-20 DIAGNOSIS — Z8659 Personal history of other mental and behavioral disorders: Secondary | ICD-10-CM | POA: Diagnosis not present

## 2020-08-20 DIAGNOSIS — F419 Anxiety disorder, unspecified: Secondary | ICD-10-CM | POA: Diagnosis not present

## 2020-08-20 DIAGNOSIS — I10 Essential (primary) hypertension: Secondary | ICD-10-CM | POA: Diagnosis not present

## 2020-08-20 DIAGNOSIS — F329 Major depressive disorder, single episode, unspecified: Secondary | ICD-10-CM | POA: Diagnosis not present

## 2020-08-20 DIAGNOSIS — M810 Age-related osteoporosis without current pathological fracture: Secondary | ICD-10-CM | POA: Diagnosis not present

## 2020-08-20 DIAGNOSIS — R7303 Prediabetes: Secondary | ICD-10-CM | POA: Diagnosis not present

## 2020-09-09 DIAGNOSIS — E538 Deficiency of other specified B group vitamins: Secondary | ICD-10-CM | POA: Diagnosis not present

## 2020-09-09 DIAGNOSIS — F329 Major depressive disorder, single episode, unspecified: Secondary | ICD-10-CM | POA: Diagnosis not present

## 2020-09-09 DIAGNOSIS — Z0000 Encounter for general adult medical examination without abnormal findings: Secondary | ICD-10-CM | POA: Diagnosis not present

## 2020-09-09 DIAGNOSIS — F419 Anxiety disorder, unspecified: Secondary | ICD-10-CM | POA: Diagnosis not present

## 2020-09-09 DIAGNOSIS — S60459A Superficial foreign body of unspecified finger, initial encounter: Secondary | ICD-10-CM | POA: Diagnosis not present

## 2020-09-09 DIAGNOSIS — R7301 Impaired fasting glucose: Secondary | ICD-10-CM | POA: Diagnosis not present

## 2020-09-09 DIAGNOSIS — I10 Essential (primary) hypertension: Secondary | ICD-10-CM | POA: Diagnosis not present

## 2020-09-09 DIAGNOSIS — Z8659 Personal history of other mental and behavioral disorders: Secondary | ICD-10-CM | POA: Diagnosis not present

## 2020-09-09 DIAGNOSIS — E559 Vitamin D deficiency, unspecified: Secondary | ICD-10-CM | POA: Diagnosis not present

## 2020-09-09 DIAGNOSIS — E785 Hyperlipidemia, unspecified: Secondary | ICD-10-CM | POA: Diagnosis not present

## 2020-10-22 DIAGNOSIS — H1045 Other chronic allergic conjunctivitis: Secondary | ICD-10-CM | POA: Diagnosis not present

## 2020-10-22 DIAGNOSIS — R7309 Other abnormal glucose: Secondary | ICD-10-CM | POA: Diagnosis not present

## 2020-10-22 DIAGNOSIS — H35373 Puckering of macula, bilateral: Secondary | ICD-10-CM | POA: Diagnosis not present

## 2020-10-22 DIAGNOSIS — Z961 Presence of intraocular lens: Secondary | ICD-10-CM | POA: Diagnosis not present

## 2020-10-22 DIAGNOSIS — H04123 Dry eye syndrome of bilateral lacrimal glands: Secondary | ICD-10-CM | POA: Diagnosis not present

## 2020-11-13 DIAGNOSIS — Z23 Encounter for immunization: Secondary | ICD-10-CM | POA: Diagnosis not present

## 2021-03-11 DIAGNOSIS — E538 Deficiency of other specified B group vitamins: Secondary | ICD-10-CM | POA: Diagnosis not present

## 2021-03-11 DIAGNOSIS — E559 Vitamin D deficiency, unspecified: Secondary | ICD-10-CM | POA: Diagnosis not present

## 2021-03-11 DIAGNOSIS — R7301 Impaired fasting glucose: Secondary | ICD-10-CM | POA: Diagnosis not present

## 2021-03-11 DIAGNOSIS — I10 Essential (primary) hypertension: Secondary | ICD-10-CM | POA: Diagnosis not present

## 2021-03-11 DIAGNOSIS — E785 Hyperlipidemia, unspecified: Secondary | ICD-10-CM | POA: Diagnosis not present

## 2021-07-28 DIAGNOSIS — Z8739 Personal history of other diseases of the musculoskeletal system and connective tissue: Secondary | ICD-10-CM | POA: Diagnosis not present

## 2021-07-28 DIAGNOSIS — R946 Abnormal results of thyroid function studies: Secondary | ICD-10-CM | POA: Diagnosis not present

## 2021-07-28 DIAGNOSIS — E559 Vitamin D deficiency, unspecified: Secondary | ICD-10-CM | POA: Diagnosis not present

## 2021-07-28 DIAGNOSIS — R140 Abdominal distension (gaseous): Secondary | ICD-10-CM | POA: Diagnosis not present

## 2021-07-28 DIAGNOSIS — R2689 Other abnormalities of gait and mobility: Secondary | ICD-10-CM | POA: Diagnosis not present

## 2021-07-28 DIAGNOSIS — M549 Dorsalgia, unspecified: Secondary | ICD-10-CM | POA: Diagnosis not present

## 2021-07-28 DIAGNOSIS — R296 Repeated falls: Secondary | ICD-10-CM | POA: Diagnosis not present

## 2021-08-05 DIAGNOSIS — R2681 Unsteadiness on feet: Secondary | ICD-10-CM | POA: Diagnosis not present

## 2021-08-05 DIAGNOSIS — M62551 Muscle wasting and atrophy, not elsewhere classified, right thigh: Secondary | ICD-10-CM | POA: Diagnosis not present

## 2021-08-05 DIAGNOSIS — M62552 Muscle wasting and atrophy, not elsewhere classified, left thigh: Secondary | ICD-10-CM | POA: Diagnosis not present

## 2021-08-05 DIAGNOSIS — R269 Unspecified abnormalities of gait and mobility: Secondary | ICD-10-CM | POA: Diagnosis not present

## 2021-08-10 ENCOUNTER — Other Ambulatory Visit: Payer: Self-pay | Admitting: Family Medicine

## 2021-08-10 DIAGNOSIS — R2689 Other abnormalities of gait and mobility: Secondary | ICD-10-CM

## 2021-08-10 DIAGNOSIS — R252 Cramp and spasm: Secondary | ICD-10-CM

## 2021-08-12 DIAGNOSIS — R2681 Unsteadiness on feet: Secondary | ICD-10-CM | POA: Diagnosis not present

## 2021-08-12 DIAGNOSIS — M62551 Muscle wasting and atrophy, not elsewhere classified, right thigh: Secondary | ICD-10-CM | POA: Diagnosis not present

## 2021-08-12 DIAGNOSIS — M62552 Muscle wasting and atrophy, not elsewhere classified, left thigh: Secondary | ICD-10-CM | POA: Diagnosis not present

## 2021-08-12 DIAGNOSIS — R269 Unspecified abnormalities of gait and mobility: Secondary | ICD-10-CM | POA: Diagnosis not present

## 2021-08-13 DIAGNOSIS — R2681 Unsteadiness on feet: Secondary | ICD-10-CM | POA: Diagnosis not present

## 2021-08-13 DIAGNOSIS — M62551 Muscle wasting and atrophy, not elsewhere classified, right thigh: Secondary | ICD-10-CM | POA: Diagnosis not present

## 2021-08-13 DIAGNOSIS — M62552 Muscle wasting and atrophy, not elsewhere classified, left thigh: Secondary | ICD-10-CM | POA: Diagnosis not present

## 2021-08-13 DIAGNOSIS — R269 Unspecified abnormalities of gait and mobility: Secondary | ICD-10-CM | POA: Diagnosis not present

## 2021-08-18 DIAGNOSIS — R269 Unspecified abnormalities of gait and mobility: Secondary | ICD-10-CM | POA: Diagnosis not present

## 2021-08-18 DIAGNOSIS — M62552 Muscle wasting and atrophy, not elsewhere classified, left thigh: Secondary | ICD-10-CM | POA: Diagnosis not present

## 2021-08-18 DIAGNOSIS — R2681 Unsteadiness on feet: Secondary | ICD-10-CM | POA: Diagnosis not present

## 2021-08-18 DIAGNOSIS — M62551 Muscle wasting and atrophy, not elsewhere classified, right thigh: Secondary | ICD-10-CM | POA: Diagnosis not present

## 2021-08-20 DIAGNOSIS — R2681 Unsteadiness on feet: Secondary | ICD-10-CM | POA: Diagnosis not present

## 2021-08-20 DIAGNOSIS — M62552 Muscle wasting and atrophy, not elsewhere classified, left thigh: Secondary | ICD-10-CM | POA: Diagnosis not present

## 2021-08-20 DIAGNOSIS — M62551 Muscle wasting and atrophy, not elsewhere classified, right thigh: Secondary | ICD-10-CM | POA: Diagnosis not present

## 2021-08-20 DIAGNOSIS — R269 Unspecified abnormalities of gait and mobility: Secondary | ICD-10-CM | POA: Diagnosis not present

## 2021-08-24 DIAGNOSIS — R2681 Unsteadiness on feet: Secondary | ICD-10-CM | POA: Diagnosis not present

## 2021-08-24 DIAGNOSIS — M62551 Muscle wasting and atrophy, not elsewhere classified, right thigh: Secondary | ICD-10-CM | POA: Diagnosis not present

## 2021-08-24 DIAGNOSIS — R269 Unspecified abnormalities of gait and mobility: Secondary | ICD-10-CM | POA: Diagnosis not present

## 2021-08-24 DIAGNOSIS — M62552 Muscle wasting and atrophy, not elsewhere classified, left thigh: Secondary | ICD-10-CM | POA: Diagnosis not present

## 2021-08-26 ENCOUNTER — Ambulatory Visit
Admission: RE | Admit: 2021-08-26 | Discharge: 2021-08-26 | Disposition: A | Discharge status: Home / Self Care | Payer: Medicare Other | Source: Ambulatory Visit | Attending: Family Medicine | Admitting: Family Medicine

## 2021-08-26 DIAGNOSIS — G319 Degenerative disease of nervous system, unspecified: Secondary | ICD-10-CM | POA: Diagnosis not present

## 2021-08-26 DIAGNOSIS — R252 Cramp and spasm: Secondary | ICD-10-CM

## 2021-08-26 DIAGNOSIS — I6782 Cerebral ischemia: Secondary | ICD-10-CM | POA: Diagnosis not present

## 2021-08-26 DIAGNOSIS — R2689 Other abnormalities of gait and mobility: Secondary | ICD-10-CM

## 2021-08-26 DIAGNOSIS — I611 Nontraumatic intracerebral hemorrhage in hemisphere, cortical: Secondary | ICD-10-CM | POA: Diagnosis not present

## 2021-08-26 DIAGNOSIS — I6381 Other cerebral infarction due to occlusion or stenosis of small artery: Secondary | ICD-10-CM | POA: Diagnosis not present

## 2021-08-26 MED ORDER — GADOBENATE DIMEGLUMINE 529 MG/ML IV SOLN
12.0000 mL | Freq: Once | INTRAVENOUS | Status: AC | PRN
  Administered 2021-08-26: 12 mL via INTRAVENOUS

## 2021-08-27 DIAGNOSIS — R2681 Unsteadiness on feet: Secondary | ICD-10-CM | POA: Diagnosis not present

## 2021-08-27 DIAGNOSIS — R269 Unspecified abnormalities of gait and mobility: Secondary | ICD-10-CM | POA: Diagnosis not present

## 2021-08-27 DIAGNOSIS — M62552 Muscle wasting and atrophy, not elsewhere classified, left thigh: Secondary | ICD-10-CM | POA: Diagnosis not present

## 2021-08-27 DIAGNOSIS — M62551 Muscle wasting and atrophy, not elsewhere classified, right thigh: Secondary | ICD-10-CM | POA: Diagnosis not present

## 2021-09-02 DIAGNOSIS — R269 Unspecified abnormalities of gait and mobility: Secondary | ICD-10-CM | POA: Diagnosis not present

## 2021-09-02 DIAGNOSIS — M62552 Muscle wasting and atrophy, not elsewhere classified, left thigh: Secondary | ICD-10-CM | POA: Diagnosis not present

## 2021-09-02 DIAGNOSIS — R2681 Unsteadiness on feet: Secondary | ICD-10-CM | POA: Diagnosis not present

## 2021-09-02 DIAGNOSIS — M62551 Muscle wasting and atrophy, not elsewhere classified, right thigh: Secondary | ICD-10-CM | POA: Diagnosis not present

## 2021-09-03 DIAGNOSIS — R2681 Unsteadiness on feet: Secondary | ICD-10-CM | POA: Diagnosis not present

## 2021-09-03 DIAGNOSIS — R269 Unspecified abnormalities of gait and mobility: Secondary | ICD-10-CM | POA: Diagnosis not present

## 2021-09-03 DIAGNOSIS — M62552 Muscle wasting and atrophy, not elsewhere classified, left thigh: Secondary | ICD-10-CM | POA: Diagnosis not present

## 2021-09-03 DIAGNOSIS — M62551 Muscle wasting and atrophy, not elsewhere classified, right thigh: Secondary | ICD-10-CM | POA: Diagnosis not present

## 2021-09-10 DIAGNOSIS — R2681 Unsteadiness on feet: Secondary | ICD-10-CM | POA: Diagnosis not present

## 2021-09-10 DIAGNOSIS — M62551 Muscle wasting and atrophy, not elsewhere classified, right thigh: Secondary | ICD-10-CM | POA: Diagnosis not present

## 2021-09-10 DIAGNOSIS — R269 Unspecified abnormalities of gait and mobility: Secondary | ICD-10-CM | POA: Diagnosis not present

## 2021-09-10 DIAGNOSIS — M62552 Muscle wasting and atrophy, not elsewhere classified, left thigh: Secondary | ICD-10-CM | POA: Diagnosis not present

## 2021-09-14 DIAGNOSIS — M62551 Muscle wasting and atrophy, not elsewhere classified, right thigh: Secondary | ICD-10-CM | POA: Diagnosis not present

## 2021-09-14 DIAGNOSIS — R269 Unspecified abnormalities of gait and mobility: Secondary | ICD-10-CM | POA: Diagnosis not present

## 2021-09-14 DIAGNOSIS — M62552 Muscle wasting and atrophy, not elsewhere classified, left thigh: Secondary | ICD-10-CM | POA: Diagnosis not present

## 2021-09-14 DIAGNOSIS — R2681 Unsteadiness on feet: Secondary | ICD-10-CM | POA: Diagnosis not present

## 2021-09-15 DIAGNOSIS — R946 Abnormal results of thyroid function studies: Secondary | ICD-10-CM | POA: Diagnosis not present

## 2021-09-15 DIAGNOSIS — E785 Hyperlipidemia, unspecified: Secondary | ICD-10-CM | POA: Diagnosis not present

## 2021-09-15 DIAGNOSIS — E559 Vitamin D deficiency, unspecified: Secondary | ICD-10-CM | POA: Diagnosis not present

## 2021-09-15 DIAGNOSIS — I10 Essential (primary) hypertension: Secondary | ICD-10-CM | POA: Diagnosis not present

## 2021-09-15 DIAGNOSIS — R7309 Other abnormal glucose: Secondary | ICD-10-CM | POA: Diagnosis not present

## 2021-09-17 DIAGNOSIS — M62551 Muscle wasting and atrophy, not elsewhere classified, right thigh: Secondary | ICD-10-CM | POA: Diagnosis not present

## 2021-09-17 DIAGNOSIS — R269 Unspecified abnormalities of gait and mobility: Secondary | ICD-10-CM | POA: Diagnosis not present

## 2021-09-17 DIAGNOSIS — R2681 Unsteadiness on feet: Secondary | ICD-10-CM | POA: Diagnosis not present

## 2021-09-17 DIAGNOSIS — M62552 Muscle wasting and atrophy, not elsewhere classified, left thigh: Secondary | ICD-10-CM | POA: Diagnosis not present

## 2021-09-21 DIAGNOSIS — R2681 Unsteadiness on feet: Secondary | ICD-10-CM | POA: Diagnosis not present

## 2021-09-21 DIAGNOSIS — R269 Unspecified abnormalities of gait and mobility: Secondary | ICD-10-CM | POA: Diagnosis not present

## 2021-09-21 DIAGNOSIS — M62551 Muscle wasting and atrophy, not elsewhere classified, right thigh: Secondary | ICD-10-CM | POA: Diagnosis not present

## 2021-09-21 DIAGNOSIS — M62552 Muscle wasting and atrophy, not elsewhere classified, left thigh: Secondary | ICD-10-CM | POA: Diagnosis not present

## 2021-09-22 DIAGNOSIS — F419 Anxiety disorder, unspecified: Secondary | ICD-10-CM | POA: Diagnosis not present

## 2021-09-22 DIAGNOSIS — I6381 Other cerebral infarction due to occlusion or stenosis of small artery: Secondary | ICD-10-CM | POA: Diagnosis not present

## 2021-09-22 DIAGNOSIS — F329 Major depressive disorder, single episode, unspecified: Secondary | ICD-10-CM | POA: Diagnosis not present

## 2021-09-22 DIAGNOSIS — M810 Age-related osteoporosis without current pathological fracture: Secondary | ICD-10-CM | POA: Diagnosis not present

## 2021-09-22 DIAGNOSIS — I679 Cerebrovascular disease, unspecified: Secondary | ICD-10-CM | POA: Diagnosis not present

## 2021-09-22 DIAGNOSIS — Z0000 Encounter for general adult medical examination without abnormal findings: Secondary | ICD-10-CM | POA: Diagnosis not present

## 2021-09-22 DIAGNOSIS — E785 Hyperlipidemia, unspecified: Secondary | ICD-10-CM | POA: Diagnosis not present

## 2021-09-22 DIAGNOSIS — R42 Dizziness and giddiness: Secondary | ICD-10-CM | POA: Diagnosis not present

## 2021-09-22 DIAGNOSIS — R296 Repeated falls: Secondary | ICD-10-CM | POA: Diagnosis not present

## 2021-09-22 DIAGNOSIS — R2689 Other abnormalities of gait and mobility: Secondary | ICD-10-CM | POA: Diagnosis not present

## 2021-09-22 DIAGNOSIS — I10 Essential (primary) hypertension: Secondary | ICD-10-CM | POA: Diagnosis not present

## 2021-09-22 DIAGNOSIS — Z8659 Personal history of other mental and behavioral disorders: Secondary | ICD-10-CM | POA: Diagnosis not present

## 2021-09-24 DIAGNOSIS — M62552 Muscle wasting and atrophy, not elsewhere classified, left thigh: Secondary | ICD-10-CM | POA: Diagnosis not present

## 2021-09-24 DIAGNOSIS — R269 Unspecified abnormalities of gait and mobility: Secondary | ICD-10-CM | POA: Diagnosis not present

## 2021-09-24 DIAGNOSIS — R2681 Unsteadiness on feet: Secondary | ICD-10-CM | POA: Diagnosis not present

## 2021-09-24 DIAGNOSIS — M62551 Muscle wasting and atrophy, not elsewhere classified, right thigh: Secondary | ICD-10-CM | POA: Diagnosis not present

## 2021-09-28 DIAGNOSIS — R269 Unspecified abnormalities of gait and mobility: Secondary | ICD-10-CM | POA: Diagnosis not present

## 2021-09-28 DIAGNOSIS — R2681 Unsteadiness on feet: Secondary | ICD-10-CM | POA: Diagnosis not present

## 2021-09-28 DIAGNOSIS — M62552 Muscle wasting and atrophy, not elsewhere classified, left thigh: Secondary | ICD-10-CM | POA: Diagnosis not present

## 2021-09-28 DIAGNOSIS — M62551 Muscle wasting and atrophy, not elsewhere classified, right thigh: Secondary | ICD-10-CM | POA: Diagnosis not present

## 2021-09-30 DIAGNOSIS — I6381 Other cerebral infarction due to occlusion or stenosis of small artery: Secondary | ICD-10-CM | POA: Diagnosis not present

## 2021-09-30 DIAGNOSIS — I679 Cerebrovascular disease, unspecified: Secondary | ICD-10-CM | POA: Diagnosis not present

## 2021-09-30 DIAGNOSIS — I6523 Occlusion and stenosis of bilateral carotid arteries: Secondary | ICD-10-CM | POA: Diagnosis not present

## 2021-10-01 DIAGNOSIS — M62552 Muscle wasting and atrophy, not elsewhere classified, left thigh: Secondary | ICD-10-CM | POA: Diagnosis not present

## 2021-10-01 DIAGNOSIS — R269 Unspecified abnormalities of gait and mobility: Secondary | ICD-10-CM | POA: Diagnosis not present

## 2021-10-01 DIAGNOSIS — M62551 Muscle wasting and atrophy, not elsewhere classified, right thigh: Secondary | ICD-10-CM | POA: Diagnosis not present

## 2021-10-01 DIAGNOSIS — R2681 Unsteadiness on feet: Secondary | ICD-10-CM | POA: Diagnosis not present

## 2021-10-08 DIAGNOSIS — R2681 Unsteadiness on feet: Secondary | ICD-10-CM | POA: Diagnosis not present

## 2021-10-08 DIAGNOSIS — R269 Unspecified abnormalities of gait and mobility: Secondary | ICD-10-CM | POA: Diagnosis not present

## 2021-10-08 DIAGNOSIS — M62551 Muscle wasting and atrophy, not elsewhere classified, right thigh: Secondary | ICD-10-CM | POA: Diagnosis not present

## 2021-10-08 DIAGNOSIS — M62552 Muscle wasting and atrophy, not elsewhere classified, left thigh: Secondary | ICD-10-CM | POA: Diagnosis not present

## 2021-10-11 DIAGNOSIS — M62552 Muscle wasting and atrophy, not elsewhere classified, left thigh: Secondary | ICD-10-CM | POA: Diagnosis not present

## 2021-10-11 DIAGNOSIS — R2681 Unsteadiness on feet: Secondary | ICD-10-CM | POA: Diagnosis not present

## 2021-10-11 DIAGNOSIS — M62551 Muscle wasting and atrophy, not elsewhere classified, right thigh: Secondary | ICD-10-CM | POA: Diagnosis not present

## 2021-10-11 DIAGNOSIS — R269 Unspecified abnormalities of gait and mobility: Secondary | ICD-10-CM | POA: Diagnosis not present

## 2021-10-13 DIAGNOSIS — M62552 Muscle wasting and atrophy, not elsewhere classified, left thigh: Secondary | ICD-10-CM | POA: Diagnosis not present

## 2021-10-13 DIAGNOSIS — R2681 Unsteadiness on feet: Secondary | ICD-10-CM | POA: Diagnosis not present

## 2021-10-13 DIAGNOSIS — M62551 Muscle wasting and atrophy, not elsewhere classified, right thigh: Secondary | ICD-10-CM | POA: Diagnosis not present

## 2021-10-13 DIAGNOSIS — R269 Unspecified abnormalities of gait and mobility: Secondary | ICD-10-CM | POA: Diagnosis not present

## 2021-10-18 DIAGNOSIS — M62552 Muscle wasting and atrophy, not elsewhere classified, left thigh: Secondary | ICD-10-CM | POA: Diagnosis not present

## 2021-10-18 DIAGNOSIS — R2681 Unsteadiness on feet: Secondary | ICD-10-CM | POA: Diagnosis not present

## 2021-10-18 DIAGNOSIS — R269 Unspecified abnormalities of gait and mobility: Secondary | ICD-10-CM | POA: Diagnosis not present

## 2021-10-18 DIAGNOSIS — M62551 Muscle wasting and atrophy, not elsewhere classified, right thigh: Secondary | ICD-10-CM | POA: Diagnosis not present

## 2021-10-20 ENCOUNTER — Other Ambulatory Visit: Payer: Self-pay | Admitting: Internal Medicine

## 2021-10-20 ENCOUNTER — Other Ambulatory Visit: Payer: Self-pay | Admitting: Family Medicine

## 2021-10-20 DIAGNOSIS — R2681 Unsteadiness on feet: Secondary | ICD-10-CM | POA: Diagnosis not present

## 2021-10-20 DIAGNOSIS — M62552 Muscle wasting and atrophy, not elsewhere classified, left thigh: Secondary | ICD-10-CM | POA: Diagnosis not present

## 2021-10-20 DIAGNOSIS — R269 Unspecified abnormalities of gait and mobility: Secondary | ICD-10-CM | POA: Diagnosis not present

## 2021-10-20 DIAGNOSIS — Z1231 Encounter for screening mammogram for malignant neoplasm of breast: Secondary | ICD-10-CM

## 2021-10-20 DIAGNOSIS — M62551 Muscle wasting and atrophy, not elsewhere classified, right thigh: Secondary | ICD-10-CM | POA: Diagnosis not present

## 2021-10-25 DIAGNOSIS — M62551 Muscle wasting and atrophy, not elsewhere classified, right thigh: Secondary | ICD-10-CM | POA: Diagnosis not present

## 2021-10-25 DIAGNOSIS — R2681 Unsteadiness on feet: Secondary | ICD-10-CM | POA: Diagnosis not present

## 2021-10-25 DIAGNOSIS — M62552 Muscle wasting and atrophy, not elsewhere classified, left thigh: Secondary | ICD-10-CM | POA: Diagnosis not present

## 2021-10-25 DIAGNOSIS — R269 Unspecified abnormalities of gait and mobility: Secondary | ICD-10-CM | POA: Diagnosis not present

## 2021-11-02 ENCOUNTER — Ambulatory Visit: Payer: Medicare Other

## 2021-11-04 DIAGNOSIS — M62552 Muscle wasting and atrophy, not elsewhere classified, left thigh: Secondary | ICD-10-CM | POA: Diagnosis not present

## 2021-11-04 DIAGNOSIS — R2681 Unsteadiness on feet: Secondary | ICD-10-CM | POA: Diagnosis not present

## 2021-11-04 DIAGNOSIS — R269 Unspecified abnormalities of gait and mobility: Secondary | ICD-10-CM | POA: Diagnosis not present

## 2021-11-04 DIAGNOSIS — M62551 Muscle wasting and atrophy, not elsewhere classified, right thigh: Secondary | ICD-10-CM | POA: Diagnosis not present

## 2021-11-05 ENCOUNTER — Ambulatory Visit
Admission: RE | Admit: 2021-11-05 | Discharge: 2021-11-05 | Disposition: A | Discharge status: Home / Self Care | Payer: Medicare Other | Source: Ambulatory Visit | Attending: Family Medicine | Admitting: Family Medicine

## 2021-11-05 DIAGNOSIS — Z1231 Encounter for screening mammogram for malignant neoplasm of breast: Secondary | ICD-10-CM

## 2021-11-09 DIAGNOSIS — M62551 Muscle wasting and atrophy, not elsewhere classified, right thigh: Secondary | ICD-10-CM | POA: Diagnosis not present

## 2021-11-09 DIAGNOSIS — M62552 Muscle wasting and atrophy, not elsewhere classified, left thigh: Secondary | ICD-10-CM | POA: Diagnosis not present

## 2021-11-09 DIAGNOSIS — R269 Unspecified abnormalities of gait and mobility: Secondary | ICD-10-CM | POA: Diagnosis not present

## 2021-11-09 DIAGNOSIS — R2681 Unsteadiness on feet: Secondary | ICD-10-CM | POA: Diagnosis not present

## 2021-11-10 DIAGNOSIS — I679 Cerebrovascular disease, unspecified: Secondary | ICD-10-CM | POA: Diagnosis not present

## 2021-11-10 DIAGNOSIS — E785 Hyperlipidemia, unspecified: Secondary | ICD-10-CM | POA: Diagnosis not present

## 2021-11-10 DIAGNOSIS — I6381 Other cerebral infarction due to occlusion or stenosis of small artery: Secondary | ICD-10-CM | POA: Diagnosis not present

## 2021-11-11 DIAGNOSIS — R2681 Unsteadiness on feet: Secondary | ICD-10-CM | POA: Diagnosis not present

## 2021-11-11 DIAGNOSIS — R269 Unspecified abnormalities of gait and mobility: Secondary | ICD-10-CM | POA: Diagnosis not present

## 2021-11-11 DIAGNOSIS — M62551 Muscle wasting and atrophy, not elsewhere classified, right thigh: Secondary | ICD-10-CM | POA: Diagnosis not present

## 2021-11-11 DIAGNOSIS — M62552 Muscle wasting and atrophy, not elsewhere classified, left thigh: Secondary | ICD-10-CM | POA: Diagnosis not present

## 2021-11-17 DIAGNOSIS — E785 Hyperlipidemia, unspecified: Secondary | ICD-10-CM | POA: Diagnosis not present

## 2021-11-17 DIAGNOSIS — F329 Major depressive disorder, single episode, unspecified: Secondary | ICD-10-CM | POA: Diagnosis not present

## 2021-11-17 DIAGNOSIS — F419 Anxiety disorder, unspecified: Secondary | ICD-10-CM | POA: Diagnosis not present

## 2021-11-17 DIAGNOSIS — Z8673 Personal history of transient ischemic attack (TIA), and cerebral infarction without residual deficits: Secondary | ICD-10-CM | POA: Diagnosis not present

## 2021-11-17 DIAGNOSIS — Z8659 Personal history of other mental and behavioral disorders: Secondary | ICD-10-CM | POA: Diagnosis not present

## 2021-11-17 DIAGNOSIS — R7309 Other abnormal glucose: Secondary | ICD-10-CM | POA: Diagnosis not present

## 2021-11-17 DIAGNOSIS — Z9181 History of falling: Secondary | ICD-10-CM | POA: Diagnosis not present

## 2021-11-17 DIAGNOSIS — I679 Cerebrovascular disease, unspecified: Secondary | ICD-10-CM | POA: Diagnosis not present

## 2021-12-17 ENCOUNTER — Encounter (HOSPITAL_BASED_OUTPATIENT_CLINIC_OR_DEPARTMENT_OTHER): Payer: Self-pay

## 2021-12-17 ENCOUNTER — Emergency Department (HOSPITAL_BASED_OUTPATIENT_CLINIC_OR_DEPARTMENT_OTHER)
Admission: EM | Admit: 2021-12-17 | Discharge: 2021-12-17 | Disposition: A | Discharge status: Home / Self Care | Payer: Medicare Other | Attending: Emergency Medicine | Admitting: Emergency Medicine

## 2021-12-17 ENCOUNTER — Other Ambulatory Visit (HOSPITAL_BASED_OUTPATIENT_CLINIC_OR_DEPARTMENT_OTHER): Payer: Self-pay

## 2021-12-17 ENCOUNTER — Emergency Department (HOSPITAL_BASED_OUTPATIENT_CLINIC_OR_DEPARTMENT_OTHER): Payer: Medicare Other

## 2021-12-17 ENCOUNTER — Other Ambulatory Visit: Payer: Self-pay

## 2021-12-17 DIAGNOSIS — S0990XA Unspecified injury of head, initial encounter: Secondary | ICD-10-CM | POA: Diagnosis not present

## 2021-12-17 DIAGNOSIS — R531 Weakness: Secondary | ICD-10-CM | POA: Diagnosis not present

## 2021-12-17 DIAGNOSIS — I77810 Thoracic aortic ectasia: Secondary | ICD-10-CM | POA: Insufficient documentation

## 2021-12-17 DIAGNOSIS — I6782 Cerebral ischemia: Secondary | ICD-10-CM | POA: Diagnosis not present

## 2021-12-17 DIAGNOSIS — W19XXXA Unspecified fall, initial encounter: Secondary | ICD-10-CM

## 2021-12-17 DIAGNOSIS — S2241XA Multiple fractures of ribs, right side, initial encounter for closed fracture: Secondary | ICD-10-CM

## 2021-12-17 DIAGNOSIS — W11XXXA Fall on and from ladder, initial encounter: Secondary | ICD-10-CM | POA: Insufficient documentation

## 2021-12-17 DIAGNOSIS — Z8673 Personal history of transient ischemic attack (TIA), and cerebral infarction without residual deficits: Secondary | ICD-10-CM | POA: Insufficient documentation

## 2021-12-17 DIAGNOSIS — M47816 Spondylosis without myelopathy or radiculopathy, lumbar region: Secondary | ICD-10-CM | POA: Diagnosis not present

## 2021-12-17 DIAGNOSIS — M546 Pain in thoracic spine: Secondary | ICD-10-CM | POA: Insufficient documentation

## 2021-12-17 DIAGNOSIS — I2510 Atherosclerotic heart disease of native coronary artery without angina pectoris: Secondary | ICD-10-CM | POA: Diagnosis not present

## 2021-12-17 DIAGNOSIS — M47812 Spondylosis without myelopathy or radiculopathy, cervical region: Secondary | ICD-10-CM | POA: Diagnosis not present

## 2021-12-17 DIAGNOSIS — S20301A Unspecified superficial injuries of right front wall of thorax, initial encounter: Secondary | ICD-10-CM | POA: Diagnosis present

## 2021-12-17 DIAGNOSIS — R220 Localized swelling, mass and lump, head: Secondary | ICD-10-CM | POA: Insufficient documentation

## 2021-12-17 DIAGNOSIS — K449 Diaphragmatic hernia without obstruction or gangrene: Secondary | ICD-10-CM | POA: Diagnosis not present

## 2021-12-17 MED ORDER — ACETAMINOPHEN 500 MG PO TABS
1000.0000 mg | ORAL_TABLET | Freq: Once | ORAL | Status: AC
  Administered 2021-12-17: 1000 mg via ORAL
  Filled 2021-12-17: qty 2

## 2021-12-17 MED ORDER — ACETAMINOPHEN 500 MG PO TABS
1000.0000 mg | ORAL_TABLET | Freq: Four times a day (QID) | ORAL | 0 refills | Status: DC | PRN
  Filled 2021-12-17: qty 60, 8d supply, fill #0

## 2021-12-17 NOTE — ED Notes (Signed)
Patient transported to CT 

## 2021-12-17 NOTE — ED Provider Notes (Signed)
[facility] EMERGENCY DEPT Provider Note   CSN: [id number] Arrival date & time: 12/17/21  1323     History Chief Complaint  Patient presents with   Fall    HPI Selena Morris is a 76 y.o. female presenting for ground-level fall.  She states that she was standing on a ladder on her bed when she lost balance.  Fortunately she fell onto the bed but she did hit her ribs on the headboard.  She has right-sided thoracic pain.  She has been ambulatory and tolerating p.o. intake but feels weak.  She denies fevers or chills nausea vomiting, syncope or shortness of breath.  No known sick contacts..   Patient's recorded medical, surgical, social, medication list and allergies were reviewed in the Snapshot window as part of the initial history.   Review of Systems   Review of Systems  Constitutional:  Negative for chills and fever.  HENT:  Negative for ear pain and sore throat.   Eyes:  Negative for pain and visual disturbance.  Respiratory:  Negative for cough and shortness of breath.   Cardiovascular:  Negative for chest pain and palpitations.  Gastrointestinal:  Negative for abdominal pain and vomiting.  Genitourinary:  Negative for dysuria and hematuria.  Musculoskeletal:  Negative for arthralgias and back pain.  Skin:  Negative for color change and rash.  Neurological:  Negative for seizures and syncope.  All other systems reviewed and are negative.   Physical Exam Updated Vital Signs BP (!) 122/95 (BP Location: Right Arm)   Pulse (!) 57   Temp 98.3 F (36.8 C) (Temporal)   Resp 18   Ht 5' (1.524 m)   Wt 59 kg   SpO2 96%   BMI 25.40 kg/m  Physical Exam Vitals and nursing note reviewed.  Constitutional:      General: She is not in acute distress.    Appearance: She is well-developed.  HENT:     Head: Normocephalic and atraumatic.  Eyes:     Conjunctiva/sclera: Conjunctivae normal.  Cardiovascular:     Rate and Rhythm: Normal rate and regular rhythm.      Heart sounds: No murmur heard. Pulmonary:     Effort: Pulmonary effort is normal. No respiratory distress.     Breath sounds: Normal breath sounds.  Abdominal:     Palpations: Abdomen is soft.     Tenderness: There is no abdominal tenderness.  Musculoskeletal:        General: Tenderness (Tenderness palpation of the right thoracic cavity) present. No swelling.     Cervical back: Neck supple. No tenderness.  Skin:    General: Skin is warm and dry.     Capillary Refill: Capillary refill takes less than 2 seconds.  Neurological:     Mental Status: She is alert.  Psychiatric:        Mood and Affect: Mood normal.      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 Recommend annual imaging followup by CTA or MRA. This recommendation follows 2010 ACCF/AHA/AATS/ACR/ASA/SCA/SCAI/SIR/STS/SVM Guidelines for the Diagnosis and Management of Patients with Thoracic Aortic Disease. Circulation. 2010; 121: X540-G867. Aortic aneurysm NOS (ICD10-I71.9)   [CC]    Clinical Course User Index [CC] Tretha Sciara, MD    Procedures Procedures   Medications Ordered in ED Medications - No data to display Medical Decision Making:    Selena Morris  is a 76 y.o. female who presented to the ED today with a moderate mechanisma trauma, detailed above.    Patient's presentation is complicated by their history of advanced age, history of stroke.  Patient placed on continuous vitals and telemetry monitoring while in ED which was reviewed periodically.   Given this mechanism of trauma, a full physical exam was performed. Notably, patient was hemodynamically stable no acute distress.   Reviewed and confirmed nursing documentation for past medical history, family history, social history.    Initial Assessment/Plan:   This is a patient presenting with a moderate mechanism  trauma.  As such, I have considered intracranial injuries including intracranial hemorrhage, intrathoracic injuries including blunt myocardial or blunt lung injury, blunt abdominal injuries including aortic dissection, bladder injury, spleen injury, liver injury and I have considered orthopedic injuries including extremity or spinal injury.   With the patient's presentation of moderate mechanism trauma but an otherwise reassuring exam, patient warrants targeted evaluation for potential traumatic injuries. Will proceed with targeted evaluation for potential injuries. Will proceed with CT head, CT spine, CT chest. Objective evaluation resulted with 2 right-sided rib fractures.   Final Reassessment and Plan:   Respiratory therapy perform incentive spirometry with patient who performed at an age and size appropriate level.  Offered hospital observation versus discharge and patient is requesting discharge.  She will use Tylenol for pain control understands the importance of continued incentive spirometry to prevent pneumonia and is otherwise stable for outpatient care management at this time.  Patient discharged with no further acute events.  Disposition:  Based on the above findings, I believe patient is stable for discharge.    Patient/family educated about specific return precautions for given chief complaint and symptoms.  Patient/family educated about follow-up with PCP.     Patient/family expressed understanding of return precautions and need for follow-up. Patient spoken to regarding all imaging and laboratory results and appropriate follow up for these results. All education provided in verbal form with additional information in written form. Time was allowed for answering of patient questions. Patient discharged.    Emergency Department Medication Summary:   Medications  acetaminophen (TYLENOL) tablet 1,000 mg (1,000 mg Oral Given 12/17/21 1451)          Clinical Impression: No diagnosis  found.   Data Unavailable   Final Clinical Impression(s) / ED Diagnoses Final diagnoses:  None    Rx / DC Orders ED Discharge Orders     None         Tretha Sciara, MD 12/17/21 1547

## 2021-12-17 NOTE — Discharge Instructions (Addendum)
There is ectasia of  ascending thoracic aorta measuring 3.8 cm. Recommend annual imaging  followup by CTA or MRA. This recommendation follows 2010  ACCF/AHA/AATS/ACR/ASA/SCA/SCAI/SIR/STS/SVM Guidelines for the  Diagnosis and Management of Patients with Thoracic Aortic Disease.  Circulation. 2010; 121: X902-I097. Aortic aneurysm NOS (ICD10-I71.9)

## 2021-12-17 NOTE — ED Triage Notes (Signed)
Patient here POV from Home.  Endorses Fall on Wednesday at 0930. Lost her Balance while attempting to clean her [employer]. Hit her Head on the Dresser. States she fell on her Ladder and injured her Back as well.   No LOC. No Anticoagulants listed in Medications List but Patient endorses Anticoagulant for Stroke sustained 1 Year ago. Unsure of Anticoagulant.  NAD Noted during Triage. A&Ox4. GCS 15. BIB Wheelchair.

## 2021-12-22 ENCOUNTER — Telehealth: Payer: Self-pay

## 2021-12-22 NOTE — Telephone Encounter (Signed)
    Patient  visit on  10/13  at [facility]   Have you been able to follow up with your primary care physician? yes  The patient was or was not able to obtain any needed medicine or equipment. yes  Are there diet recommendations that you are having difficulty following? na  Patient expresses understanding of discharge instructions and education provided has no other needs at this time. yes     Jodie Leiner Pop [facility] Care Guide, [place], Care Management  [phone] [address], [place], [place] [address] Phone: [phone] Email: [email or web address]    

## 2021-12-29 DIAGNOSIS — I2510 Atherosclerotic heart disease of native coronary artery without angina pectoris: Secondary | ICD-10-CM | POA: Diagnosis not present

## 2021-12-29 DIAGNOSIS — S2241XD Multiple fractures of ribs, right side, subsequent encounter for fracture with routine healing: Secondary | ICD-10-CM | POA: Diagnosis not present

## 2021-12-29 DIAGNOSIS — I10 Essential (primary) hypertension: Secondary | ICD-10-CM | POA: Diagnosis not present

## 2021-12-29 DIAGNOSIS — R296 Repeated falls: Secondary | ICD-10-CM | POA: Diagnosis not present

## 2022-01-06 DIAGNOSIS — S2239XA Fracture of one rib, unspecified side, initial encounter for closed fracture: Secondary | ICD-10-CM | POA: Diagnosis not present

## 2022-01-06 DIAGNOSIS — Z23 Encounter for immunization: Secondary | ICD-10-CM | POA: Diagnosis not present

## 2022-01-14 DIAGNOSIS — S2241XD Multiple fractures of ribs, right side, subsequent encounter for fracture with routine healing: Secondary | ICD-10-CM | POA: Diagnosis not present

## 2022-01-14 DIAGNOSIS — I2510 Atherosclerotic heart disease of native coronary artery without angina pectoris: Secondary | ICD-10-CM | POA: Diagnosis not present

## 2022-01-14 DIAGNOSIS — I10 Essential (primary) hypertension: Secondary | ICD-10-CM | POA: Diagnosis not present

## 2022-01-14 DIAGNOSIS — R296 Repeated falls: Secondary | ICD-10-CM | POA: Diagnosis not present

## 2022-01-20 DIAGNOSIS — M6389 Disorders of muscle in diseases classified elsewhere, multiple sites: Secondary | ICD-10-CM | POA: Diagnosis not present

## 2022-01-20 DIAGNOSIS — M5030 Other cervical disc degeneration, unspecified cervical region: Secondary | ICD-10-CM | POA: Diagnosis not present

## 2022-01-20 DIAGNOSIS — Z9181 History of falling: Secondary | ICD-10-CM | POA: Diagnosis not present

## 2022-03-10 DIAGNOSIS — E559 Vitamin D deficiency, unspecified: Secondary | ICD-10-CM | POA: Diagnosis not present

## 2022-03-10 DIAGNOSIS — Z79899 Other long term (current) drug therapy: Secondary | ICD-10-CM | POA: Diagnosis not present

## 2022-03-10 DIAGNOSIS — E7800 Pure hypercholesterolemia, unspecified: Secondary | ICD-10-CM | POA: Diagnosis not present

## 2022-03-10 DIAGNOSIS — Z8673 Personal history of transient ischemic attack (TIA), and cerebral infarction without residual deficits: Secondary | ICD-10-CM | POA: Diagnosis not present

## 2022-03-10 DIAGNOSIS — E785 Hyperlipidemia, unspecified: Secondary | ICD-10-CM | POA: Diagnosis not present

## 2022-03-10 DIAGNOSIS — I10 Essential (primary) hypertension: Secondary | ICD-10-CM | POA: Diagnosis not present

## 2022-03-10 DIAGNOSIS — R7309 Other abnormal glucose: Secondary | ICD-10-CM | POA: Diagnosis not present

## 2022-03-17 DIAGNOSIS — I10 Essential (primary) hypertension: Secondary | ICD-10-CM | POA: Diagnosis not present

## 2022-03-17 DIAGNOSIS — M810 Age-related osteoporosis without current pathological fracture: Secondary | ICD-10-CM | POA: Diagnosis not present

## 2022-03-17 DIAGNOSIS — F419 Anxiety disorder, unspecified: Secondary | ICD-10-CM | POA: Diagnosis not present

## 2022-03-17 DIAGNOSIS — R7309 Other abnormal glucose: Secondary | ICD-10-CM | POA: Diagnosis not present

## 2022-03-17 DIAGNOSIS — E559 Vitamin D deficiency, unspecified: Secondary | ICD-10-CM | POA: Diagnosis not present

## 2022-03-17 DIAGNOSIS — F329 Major depressive disorder, single episode, unspecified: Secondary | ICD-10-CM | POA: Diagnosis not present

## 2022-03-17 DIAGNOSIS — E785 Hyperlipidemia, unspecified: Secondary | ICD-10-CM | POA: Diagnosis not present

## 2022-03-17 DIAGNOSIS — Z8673 Personal history of transient ischemic attack (TIA), and cerebral infarction without residual deficits: Secondary | ICD-10-CM | POA: Diagnosis not present

## 2022-05-23 DIAGNOSIS — R112 Nausea with vomiting, unspecified: Secondary | ICD-10-CM | POA: Diagnosis not present

## 2022-05-23 DIAGNOSIS — R1011 Right upper quadrant pain: Secondary | ICD-10-CM | POA: Diagnosis not present

## 2022-05-23 DIAGNOSIS — R1013 Epigastric pain: Secondary | ICD-10-CM | POA: Diagnosis not present

## 2022-05-25 DIAGNOSIS — K7689 Other specified diseases of liver: Secondary | ICD-10-CM | POA: Diagnosis not present

## 2022-05-25 DIAGNOSIS — R1013 Epigastric pain: Secondary | ICD-10-CM | POA: Diagnosis not present

## 2022-09-19 DIAGNOSIS — I10 Essential (primary) hypertension: Secondary | ICD-10-CM | POA: Diagnosis not present

## 2022-09-19 DIAGNOSIS — E785 Hyperlipidemia, unspecified: Secondary | ICD-10-CM | POA: Diagnosis not present

## 2022-09-19 DIAGNOSIS — Z8673 Personal history of transient ischemic attack (TIA), and cerebral infarction without residual deficits: Secondary | ICD-10-CM | POA: Diagnosis not present

## 2022-09-19 DIAGNOSIS — R946 Abnormal results of thyroid function studies: Secondary | ICD-10-CM | POA: Diagnosis not present

## 2022-09-19 DIAGNOSIS — R7309 Other abnormal glucose: Secondary | ICD-10-CM | POA: Diagnosis not present

## 2022-09-19 DIAGNOSIS — E559 Vitamin D deficiency, unspecified: Secondary | ICD-10-CM | POA: Diagnosis not present

## 2022-09-19 DIAGNOSIS — M8589 Other specified disorders of bone density and structure, multiple sites: Secondary | ICD-10-CM | POA: Diagnosis not present

## 2022-09-19 DIAGNOSIS — Z79899 Other long term (current) drug therapy: Secondary | ICD-10-CM | POA: Diagnosis not present

## 2022-09-26 DIAGNOSIS — Z0000 Encounter for general adult medical examination without abnormal findings: Secondary | ICD-10-CM | POA: Diagnosis not present

## 2022-09-26 DIAGNOSIS — F419 Anxiety disorder, unspecified: Secondary | ICD-10-CM | POA: Diagnosis not present

## 2022-09-26 DIAGNOSIS — M810 Age-related osteoporosis without current pathological fracture: Secondary | ICD-10-CM | POA: Diagnosis not present

## 2022-09-26 DIAGNOSIS — I10 Essential (primary) hypertension: Secondary | ICD-10-CM | POA: Diagnosis not present

## 2022-09-26 DIAGNOSIS — I6381 Other cerebral infarction due to occlusion or stenosis of small artery: Secondary | ICD-10-CM | POA: Diagnosis not present

## 2022-09-26 DIAGNOSIS — F329 Major depressive disorder, single episode, unspecified: Secondary | ICD-10-CM | POA: Diagnosis not present

## 2022-09-26 DIAGNOSIS — E785 Hyperlipidemia, unspecified: Secondary | ICD-10-CM | POA: Diagnosis not present

## 2022-09-26 DIAGNOSIS — E559 Vitamin D deficiency, unspecified: Secondary | ICD-10-CM | POA: Diagnosis not present

## 2022-09-26 DIAGNOSIS — Z8659 Personal history of other mental and behavioral disorders: Secondary | ICD-10-CM | POA: Diagnosis not present

## 2022-09-26 DIAGNOSIS — I679 Cerebrovascular disease, unspecified: Secondary | ICD-10-CM | POA: Diagnosis not present

## 2022-09-26 DIAGNOSIS — R7309 Other abnormal glucose: Secondary | ICD-10-CM | POA: Diagnosis not present

## 2022-11-29 ENCOUNTER — Emergency Department (HOSPITAL_COMMUNITY): Payer: Medicare Other

## 2022-11-29 ENCOUNTER — Emergency Department (HOSPITAL_COMMUNITY)
Admission: EM | Admit: 2022-11-29 | Discharge: 2022-11-29 | Disposition: A | Discharge status: Home / Self Care | Payer: Medicare Other | Attending: Emergency Medicine | Admitting: Emergency Medicine

## 2022-11-29 ENCOUNTER — Other Ambulatory Visit: Payer: Self-pay

## 2022-11-29 ENCOUNTER — Encounter (HOSPITAL_COMMUNITY): Payer: Self-pay

## 2022-11-29 DIAGNOSIS — S92512A Displaced fracture of proximal phalanx of left lesser toe(s), initial encounter for closed fracture: Secondary | ICD-10-CM | POA: Diagnosis not present

## 2022-11-29 DIAGNOSIS — R531 Weakness: Secondary | ICD-10-CM | POA: Diagnosis not present

## 2022-11-29 DIAGNOSIS — S92412A Displaced fracture of proximal phalanx of left great toe, initial encounter for closed fracture: Secondary | ICD-10-CM | POA: Diagnosis not present

## 2022-11-29 DIAGNOSIS — E860 Dehydration: Secondary | ICD-10-CM

## 2022-11-29 DIAGNOSIS — R6889 Other general symptoms and signs: Secondary | ICD-10-CM | POA: Diagnosis not present

## 2022-11-29 DIAGNOSIS — Y9301 Activity, walking, marching and hiking: Secondary | ICD-10-CM | POA: Diagnosis not present

## 2022-11-29 DIAGNOSIS — M19072 Primary osteoarthritis, left ankle and foot: Secondary | ICD-10-CM | POA: Diagnosis not present

## 2022-11-29 DIAGNOSIS — Z7982 Long term (current) use of aspirin: Secondary | ICD-10-CM | POA: Diagnosis not present

## 2022-11-29 DIAGNOSIS — I10 Essential (primary) hypertension: Secondary | ICD-10-CM | POA: Diagnosis not present

## 2022-11-29 DIAGNOSIS — S92902A Unspecified fracture of left foot, initial encounter for closed fracture: Secondary | ICD-10-CM

## 2022-11-29 DIAGNOSIS — W19XXXA Unspecified fall, initial encounter: Secondary | ICD-10-CM | POA: Diagnosis not present

## 2022-11-29 DIAGNOSIS — N390 Urinary tract infection, site not specified: Secondary | ICD-10-CM

## 2022-11-29 DIAGNOSIS — S90922A Unspecified superficial injury of left foot, initial encounter: Secondary | ICD-10-CM | POA: Diagnosis present

## 2022-11-29 DIAGNOSIS — M2012 Hallux valgus (acquired), left foot: Secondary | ICD-10-CM | POA: Diagnosis not present

## 2022-11-29 LAB — CBC WITH DIFFERENTIAL/PLATELET
Abs Immature Granulocytes: 0.08 10*3/uL — ABNORMAL HIGH (ref 0.00–0.07)
Basophils Absolute: 0.1 10*3/uL (ref 0.0–0.1)
Basophils Relative: 0 %
Eosinophils Absolute: 0.1 10*3/uL (ref 0.0–0.5)
Eosinophils Relative: 1 %
HCT: 46.1 % — ABNORMAL HIGH (ref 36.0–46.0)
Hemoglobin: 15.4 g/dL — ABNORMAL HIGH (ref 12.0–15.0)
Immature Granulocytes: 1 %
Lymphocytes Relative: 17 %
Lymphs Abs: 2.8 10*3/uL (ref 0.7–4.0)
MCH: 34.1 pg — ABNORMAL HIGH (ref 26.0–34.0)
MCHC: 33.4 g/dL (ref 30.0–36.0)
MCV: 102 fL — ABNORMAL HIGH (ref 80.0–100.0)
Monocytes Absolute: 1.9 10*3/uL — ABNORMAL HIGH (ref 0.1–1.0)
Monocytes Relative: 11 %
Neutro Abs: 11.5 10*3/uL — ABNORMAL HIGH (ref 1.7–7.7)
Neutrophils Relative %: 70 %
Platelets: 231 10*3/uL (ref 150–400)
RBC: 4.52 MIL/uL (ref 3.87–5.11)
RDW: 12.5 % (ref 11.5–15.5)
WBC: 16.4 10*3/uL — ABNORMAL HIGH (ref 4.0–10.5)
nRBC: 0 % (ref 0.0–0.2)

## 2022-11-29 LAB — COMPREHENSIVE METABOLIC PANEL
ALT: 59 U/L — ABNORMAL HIGH (ref 0–44)
AST: 53 U/L — ABNORMAL HIGH (ref 15–41)
Albumin: 3.6 g/dL (ref 3.5–5.0)
Alkaline Phosphatase: 72 U/L (ref 38–126)
Anion gap: 9 (ref 5–15)
BUN: 28 mg/dL — ABNORMAL HIGH (ref 8–23)
CO2: 27 mmol/L (ref 22–32)
Calcium: 9 mg/dL (ref 8.9–10.3)
Chloride: 105 mmol/L (ref 98–111)
Creatinine, Ser: 0.71 mg/dL (ref 0.44–1.00)
GFR, Estimated: >60 mL/min (ref >60)
Glucose, Bld: 119 mg/dL — ABNORMAL HIGH (ref 70–99)
Potassium: 3.8 mmol/L (ref 3.5–5.1)
Sodium: 141 mmol/L (ref 135–145)
Total Bilirubin: 1 mg/dL (ref 0.3–1.2)
Total Protein: 6.4 g/dL — ABNORMAL LOW (ref 6.5–8.1)

## 2022-11-29 LAB — URINALYSIS, ROUTINE W REFLEX MICROSCOPIC
Bilirubin Urine: NEGATIVE
Glucose, UA: NEGATIVE mg/dL
Hgb urine dipstick: NEGATIVE
Ketones, ur: 20 mg/dL — AB
Nitrite: NEGATIVE
Protein, ur: 30 mg/dL — AB
Specific Gravity, Urine: 1.02 (ref 1.005–1.030)
WBC, UA: >50 WBC/hpf (ref 0–5)
pH: 6 (ref 5.0–8.0)

## 2022-11-29 LAB — CK: Total CK: 877 U/L — ABNORMAL HIGH (ref 38–234)

## 2022-11-29 MED ORDER — CEPHALEXIN 500 MG PO CAPS: 500.0000 mg | ORAL_CAPSULE | Freq: Three times a day (TID) | ORAL | 0 refills | Status: DC

## 2022-11-29 MED ORDER — CEPHALEXIN 500 MG PO CAPS
500.0000 mg | ORAL_CAPSULE | Freq: Once | ORAL | Status: AC
  Administered 2022-11-29: 500 mg via ORAL
  Filled 2022-11-29: qty 1

## 2022-11-29 MED ORDER — LACTATED RINGERS IV BOLUS
1000.0000 mL | Freq: Once | INTRAVENOUS | Status: AC
  Administered 2022-11-29: 1000 mL via INTRAVENOUS

## 2022-11-29 NOTE — ED Triage Notes (Signed)
Patient brought in by EMS after sustaining a fall on Saturday. Pt reports stubbing her toes and it caused her to fall. States that she was too weak to get up and was unable to get in contact with anyone over the weekend. Per EMS pt's CBG was 150 and pt was hypertensive, with an oral temp around 96. Pt A&O X 4 upon arrival.

## 2022-11-29 NOTE — ED Provider Notes (Signed)
Care of patient received from prior provider at 5:55 PM, please see their note for complete H/P and care plan.  Received handoff per ED course.  Clinical Course as of 11/29/22 1755  Tue Nov 29, 2022  1658 Stable (RL) 89 YOF with fall Found down. Been rehydrated. UA for UTI r/o [CC]    Clinical Course User Index [CC] Glyn Ade, MD    Reassessment: Evidence of UTI on UA.  Likely explains her weakness.  Son is coming to pick her up she feels comfortable outpatient care management.  Will treat with cephalexin twice daily x 5 days with plan to follow-up with PCP.  Disposition:  I have considered need for hospitalization, however, considering all of the above, I believe this patient is stable for discharge at this time.  Patient/family educated about specific return precautions for given chief complaint and symptoms.  Patient/family educated about follow-up with PCP .     Patient/family expressed understanding of return precautions and need for follow-up. Patient spoken to regarding all imaging and laboratory results and appropriate follow up for these results. All education provided in verbal form with additional information in written form. Time was allowed for answering of patient questions. Patient discharged.    Emergency Department Medication Summary:   Medications  lactated ringers bolus 1,000 mL (1,000 mLs Intravenous New Bag/Given 11/29/22 1254)  cephALEXin (KEFLEX) capsule 500 mg (500 mg Oral Given 11/29/22 1753)            Glyn Ade, MD 11/29/22 1755

## 2022-11-29 NOTE — Progress Notes (Signed)
Orthopedic Tech Progress Note Patient Details:  Selena Morris 04/29/1945 [id number]  Ortho Devices Type of Ortho Device: Postop shoe/boot Ortho Device/Splint Location: left Ortho Device/Splint Interventions: Ordered, Application, Adjustment   Post Interventions Patient Tolerated: Well Instructions Provided: Care of device, Adjustment of device  Kizzie Fantasia 11/29/2022, 3:27 PM

## 2022-11-29 NOTE — ED Provider Notes (Signed)
Selena Morris   CSN: [id number] Arrival date & time: 11/29/22  1149     History {Add pertinent medical, surgical, social history, OB history to HPI:1} Chief Complaint  Patient presents with   Weakness   Fall    Selena Morris is a 77 y.o. female.  HPI Multiple medical problems including stroke last year presents with concern for ongoing weakness, left foot pain.  Patient notes that 3 days ago she was walking, stubbed her left foot, went to the ground, and due to weakness from prior stroke she has been unable to get from the ground until today.  No focal weakness, no focal pain other than her left foot.  No fever.  She has been unable to eat, drink, or take her medication over the past 3 days.     Home Medications Prior to Admission medications   Medication Sig Start Date End Date Taking? Authorizing Provider  acetaminophen (TYLENOL) 500 MG tablet Take 2 tablets (1,000 mg total) by mouth every 6 (six) hours as needed for up to 60 doses. 12/17/21   Glyn Ade, MD  Alpha Lipoic Acid 200 MG CAPS Take 1 capsule by mouth every morning.    [provider]  aspirin EC 81 MG tablet Take 81 mg by mouth every morning.     [provider]  b complex vitamins tablet Take 1 tablet by mouth every morning.     [provider]  calcium-vitamin D (OSCAL WITH D) 500-200 MG-UNIT per tablet Take 1 tablet by mouth every morning.     [provider]  cholecalciferol (VITAMIN D) 1000 UNITS tablet Take 1,000 Units by mouth every morning.     [provider]  Coenzyme Q10 (COQ-10 PO) Take 500 mg by mouth every morning.     [provider]  escitalopram (LEXAPRO) 20 MG tablet Take 10 mg by mouth every evening.     [provider]  fish oil-omega-3 fatty acids 1000 MG capsule Take 1 g by mouth every morning.     [provider]  HYDROcodone-acetaminophen (NORCO/VICODIN)  5-325 MG per tablet Take 2 tablets by mouth every 4 (four) hours as needed for pain. Patient not taking: Reported on 05/12/2014 01/22/12   Glynn Octave, MD  ibuprofen (ADVIL,MOTRIN) 600 MG tablet Take 1 tablet (600 mg total) by mouth every 6 (six) hours as needed for pain. Patient not taking: Reported on 05/12/2014 01/22/12   Glynn Octave, MD  Nutritional Supplements (ESTROVEN ENERGY PO) Take 1 tablet by mouth every morning.     [provider]  OVER THE COUNTER MEDICATION Take 1 tablet by mouth every morning. [employer] brand allergy    [provider]  OVER THE COUNTER MEDICATION Take 1 capsule by mouth daily. Buffered C ph control    [provider]  Polyethyl Glycol-Propyl Glycol (SYSTANE OP) Apply 1-2 drops to eye daily as needed (dry eyes, dust.).    [provider]  simvastatin (ZOCOR) 10 MG tablet Take 10 mg by mouth daily at 6 PM.    [provider]      Allergies    Percocet [oxycodone-acetaminophen] and Tylox [oxycodone-acetaminophen]    Review of Systems   Review of Systems  All other systems reviewed and are negative.   Physical Exam Updated Vital Signs BP (!) 161/91 (BP Location: Left Arm)   Pulse 93   Temp 98 F (36.7 C) (Oral)   Resp 19  Ht 5' (1.524 m)   Wt 61.2 kg   SpO2 93%   BMI 26.37 kg/m  Physical Exam Vitals and nursing Morris reviewed.  Constitutional:      General: She is not in acute distress.    Appearance: She is well-developed.  HENT:     Head: Normocephalic and atraumatic.  Eyes:     Conjunctiva/sclera: Conjunctivae normal.  Cardiovascular:     Rate and Rhythm: Normal rate and regular rhythm.  Pulmonary:     Effort: Pulmonary effort is normal. No respiratory distress.     Breath sounds: Normal breath sounds. No stridor.  Abdominal:     General: There is no distension.  Musculoskeletal:       Legs:  Skin:    General: Skin is warm and dry.  Neurological:     Mental Status: She is alert and  oriented to person, place, and time.     Cranial Nerves: No cranial nerve deficit.  Psychiatric:        Mood and Affect: Mood normal.     ED Results / Procedures / Treatments   Labs (all labs ordered are listed, but only abnormal results are displayed) Labs Reviewed  CBC WITH DIFFERENTIAL/PLATELET - Abnormal; Notable for the following components:      Result Value   WBC 16.4 (*)    Hemoglobin 15.4 (*)    HCT 46.1 (*)    MCV 102.0 (*)    MCH 34.1 (*)    Neutro Abs 11.5 (*)    Monocytes Absolute 1.9 (*)    Abs Immature Granulocytes 0.08 (*)    All other components within normal limits  CK - Abnormal; Notable for the following components:   Total CK 877 (*)    All other components within normal limits  COMPREHENSIVE METABOLIC PANEL - Abnormal; Notable for the following components:   Glucose, Bld 119 (*)    BUN 28 (*)    Total Protein 6.4 (*)    AST 53 (*)    ALT 59 (*)    All other components within normal limits  URINALYSIS, ROUTINE W REFLEX MICROSCOPIC    EKG EKG Interpretation Date/Time:  Tuesday November 29 2022 12:07:32 EDT Ventricular Rate:  89 PR Interval:  117 QRS Duration:  92 QT Interval:  357 QTC Calculation: 435 R Axis:   7  Text Interpretation: Sinus rhythm Borderline short PR interval LVH with secondary repolarization abnormality Artifact in lead(s) I II III aVR aVL aVF V2 Confirmed by Gerhard Munch [phone]) on 11/29/2022 12:10:46 PM  Radiology DG Foot Complete Left  Result Date: 11/29/2022 CLINICAL DATA:  Left great toe pain after fall 3 days ago. EXAM: LEFT FOOT - COMPLETE 3+ VIEW COMPARISON:  None Available. FINDINGS: Minimally displaced fracture is seen involving the proximal base of the first proximal phalanx with intra-articular extension. Moderate hallux valgus deformity of first metatarsophalangeal joint is noted with moderate degenerative change of this joint space. No other bony abnormality is noted. IMPRESSION: Minimally displaced fracture  involving proximal base of first proximal phalanx with intra-articular extension. Electronically Signed   By: Lupita Raider M.D.   On: 11/29/2022 15:02    Procedures Procedures  {Document cardiac monitor, telemetry assessment procedure when appropriate:1}  Medications Ordered in ED Medications  lactated ringers bolus 1,000 mL (1,000 mLs Intravenous New Bag/Given 11/29/22 1254)    ED Course/ Medical Decision Making/ A&P   {   Click here for ABCD2, HEART and other calculatorsREFRESH Morris before signing :1}  Medical Decision Making Female with history of prior stroke, now presents after 3 days on the ground with ongoing pain from fall that likely led to her prolonged downtime. Patient is awake and alert, minimally tachycardic, not hypotensive, though she has not had food or water in several days, no early evidence for dehydration.,  Given her immobility, this is a concern as his rhabdomyolysis.  Additional concerns include electrolyte abnormalities, and fracture.  Cardiac 95 sinus normal Pulse ox 100% room air, chart value of 83% is erroneous.   Amount and/or Complexity of Data Reviewed External Data Reviewed: notes. Labs: ordered. Decision-making details documented in ED Course. Radiology: ordered and independent interpretation performed. Decision-making details documented in ED Course. ECG/medicine tests: ordered and independent interpretation performed. Decision-making details documented in ED Course.  Risk Prescription drug management. Decision regarding hospitalization. Diagnosis or treatment significantly limited by social determinants of health.   4:08 PM Patient awake, alert, in no distress.  Foot has had immobilization provided by orthopedic technician. Labs reviewed, notable for BUN 28, consistent with concern for dehydration.  Similarly the patient has elevated CK, though less than 1000. No evidence for renal dysfunction. She has received  fluid resuscitation notes that she feels better. She does have mild leukocytosis and on signout is awaiting urinalysis.  {Document critical care time when appropriate:1} {Document review of labs and clinical decision tools ie heart score, Chads2Vasc2 etc:1}  {Document your independent review of radiology images, and any outside records:1} {Document your discussion with family members, caretakers, and with consultants:1} {Document social determinants of health affecting pt's care:1} {Document your decision making why or why not admission, treatments were needed:1} Final Clinical Impression(s) / ED Diagnoses Final diagnoses:  Fall, initial encounter  Closed fracture of left foot, initial encounter  Dehydration    Rx / DC Orders ED Discharge Orders     None

## 2022-11-29 NOTE — Discharge Instructions (Addendum)
Today's evaluation has been notable for evidence for dehydration as well as a fracture of your left foot.  It is very important to stay well-hydrated, and monitor your condition carefully.  Schedule follow-up with both your primary care physician and our orthopedic colleagues.  This latter follow-up should be within the next week.

## 2022-12-06 DIAGNOSIS — S92812A Other fracture of left foot, initial encounter for closed fracture: Secondary | ICD-10-CM | POA: Diagnosis not present

## 2022-12-06 DIAGNOSIS — W19XXXA Unspecified fall, initial encounter: Secondary | ICD-10-CM | POA: Diagnosis not present

## 2022-12-06 DIAGNOSIS — Z23 Encounter for immunization: Secondary | ICD-10-CM | POA: Diagnosis not present

## 2022-12-09 DIAGNOSIS — S92412A Displaced fracture of proximal phalanx of left great toe, initial encounter for closed fracture: Secondary | ICD-10-CM | POA: Diagnosis not present

## 2022-12-30 DIAGNOSIS — S92412A Displaced fracture of proximal phalanx of left great toe, initial encounter for closed fracture: Secondary | ICD-10-CM | POA: Diagnosis not present

## 2023-01-02 ENCOUNTER — Telehealth: Payer: Self-pay

## 2023-01-02 NOTE — Telephone Encounter (Signed)
Transition Care Management Unsuccessful Follow-up Telephone Call  Date of discharge and from where:  [facility] 9/24  Attempts:  1st Attempt  Reason for unsuccessful TCM follow-up call:  No answer/busy   Lenard Forth [place]  [facility], [facility] Guide, Phone: [phone] Website: [email or web address]

## 2023-01-03 ENCOUNTER — Telehealth: Payer: Self-pay

## 2023-01-03 NOTE — Telephone Encounter (Signed)
Transition Care Management Follow-up Telephone Call Date of discharge and from where: [facility] 9/24 How have you been since you were released from the hospital? Doing fine  Any questions or concerns? No  Items Reviewed: Did the pt receive and understand the discharge instructions provided? Yes  Medications obtained and verified? Yes  Other? No  Any new allergies since your discharge? No  Dietary orders reviewed? No Do you have support at home? No     Follow up appointments reviewed:  PCP Hospital f/u appt confirmed? Yes  Scheduled to see  on  @ . Specialist Hospital f/u appt confirmed? Yes  Scheduled to see  on  @ . Are transportation arrangements needed? No  If their condition worsens, is the pt aware to call PCP or go to the Emergency Dept.? Yes Was the patient provided with contact information for the PCP's office or ED? Yes Was to pt encouraged to call back with questions or concerns? Yes

## 2023-01-23 DIAGNOSIS — E785 Hyperlipidemia, unspecified: Secondary | ICD-10-CM | POA: Diagnosis not present

## 2023-01-23 DIAGNOSIS — R7309 Other abnormal glucose: Secondary | ICD-10-CM | POA: Diagnosis not present

## 2023-01-23 DIAGNOSIS — I10 Essential (primary) hypertension: Secondary | ICD-10-CM | POA: Diagnosis not present

## 2023-01-27 DIAGNOSIS — S92412A Displaced fracture of proximal phalanx of left great toe, initial encounter for closed fracture: Secondary | ICD-10-CM | POA: Diagnosis not present

## 2023-01-30 DIAGNOSIS — S31809A Unspecified open wound of unspecified buttock, initial encounter: Secondary | ICD-10-CM | POA: Diagnosis not present

## 2023-01-30 DIAGNOSIS — I679 Cerebrovascular disease, unspecified: Secondary | ICD-10-CM | POA: Diagnosis not present

## 2023-01-30 DIAGNOSIS — R7309 Other abnormal glucose: Secondary | ICD-10-CM | POA: Diagnosis not present

## 2023-01-30 DIAGNOSIS — E559 Vitamin D deficiency, unspecified: Secondary | ICD-10-CM | POA: Diagnosis not present

## 2023-01-30 DIAGNOSIS — I6930 Unspecified sequelae of cerebral infarction: Secondary | ICD-10-CM | POA: Diagnosis not present

## 2023-01-30 DIAGNOSIS — I10 Essential (primary) hypertension: Secondary | ICD-10-CM | POA: Diagnosis not present

## 2023-01-30 DIAGNOSIS — R2689 Other abnormalities of gait and mobility: Secondary | ICD-10-CM | POA: Diagnosis not present

## 2023-01-30 DIAGNOSIS — E7800 Pure hypercholesterolemia, unspecified: Secondary | ICD-10-CM | POA: Diagnosis not present

## 2023-03-27 DIAGNOSIS — M25572 Pain in left ankle and joints of left foot: Secondary | ICD-10-CM | POA: Diagnosis not present

## 2023-03-27 DIAGNOSIS — M549 Dorsalgia, unspecified: Secondary | ICD-10-CM | POA: Diagnosis not present

## 2023-04-05 ENCOUNTER — Other Ambulatory Visit (HOSPITAL_COMMUNITY): Payer: Self-pay

## 2023-04-11 DIAGNOSIS — I6930 Unspecified sequelae of cerebral infarction: Secondary | ICD-10-CM | POA: Diagnosis not present

## 2023-04-11 DIAGNOSIS — M6281 Muscle weakness (generalized): Secondary | ICD-10-CM | POA: Diagnosis not present

## 2023-04-13 DIAGNOSIS — M6281 Muscle weakness (generalized): Secondary | ICD-10-CM | POA: Diagnosis not present

## 2023-04-13 DIAGNOSIS — I6930 Unspecified sequelae of cerebral infarction: Secondary | ICD-10-CM | POA: Diagnosis not present

## 2023-04-18 DIAGNOSIS — I6930 Unspecified sequelae of cerebral infarction: Secondary | ICD-10-CM | POA: Diagnosis not present

## 2023-04-18 DIAGNOSIS — M6281 Muscle weakness (generalized): Secondary | ICD-10-CM | POA: Diagnosis not present

## 2023-04-20 DIAGNOSIS — R296 Repeated falls: Secondary | ICD-10-CM | POA: Diagnosis not present

## 2023-04-20 DIAGNOSIS — R2689 Other abnormalities of gait and mobility: Secondary | ICD-10-CM | POA: Diagnosis not present

## 2023-04-20 DIAGNOSIS — F419 Anxiety disorder, unspecified: Secondary | ICD-10-CM | POA: Diagnosis not present

## 2023-04-20 DIAGNOSIS — F429 Obsessive-compulsive disorder, unspecified: Secondary | ICD-10-CM | POA: Diagnosis not present

## 2023-04-20 DIAGNOSIS — I10 Essential (primary) hypertension: Secondary | ICD-10-CM | POA: Diagnosis not present

## 2023-04-20 DIAGNOSIS — I6930 Unspecified sequelae of cerebral infarction: Secondary | ICD-10-CM | POA: Diagnosis not present

## 2023-04-25 DIAGNOSIS — M6281 Muscle weakness (generalized): Secondary | ICD-10-CM | POA: Diagnosis not present

## 2023-04-25 DIAGNOSIS — I6930 Unspecified sequelae of cerebral infarction: Secondary | ICD-10-CM | POA: Diagnosis not present

## 2023-05-02 DIAGNOSIS — M6281 Muscle weakness (generalized): Secondary | ICD-10-CM | POA: Diagnosis not present

## 2023-05-02 DIAGNOSIS — I6930 Unspecified sequelae of cerebral infarction: Secondary | ICD-10-CM | POA: Diagnosis not present

## 2023-05-03 DIAGNOSIS — H04123 Dry eye syndrome of bilateral lacrimal glands: Secondary | ICD-10-CM | POA: Diagnosis not present

## 2023-05-03 DIAGNOSIS — H53461 Homonymous bilateral field defects, right side: Secondary | ICD-10-CM | POA: Diagnosis not present

## 2023-05-03 DIAGNOSIS — I639 Cerebral infarction, unspecified: Secondary | ICD-10-CM | POA: Diagnosis not present

## 2023-05-03 DIAGNOSIS — H35373 Puckering of macula, bilateral: Secondary | ICD-10-CM | POA: Diagnosis not present

## 2023-05-03 DIAGNOSIS — H43811 Vitreous degeneration, right eye: Secondary | ICD-10-CM | POA: Diagnosis not present

## 2023-05-03 DIAGNOSIS — H1045 Other chronic allergic conjunctivitis: Secondary | ICD-10-CM | POA: Diagnosis not present

## 2023-05-09 DIAGNOSIS — I6930 Unspecified sequelae of cerebral infarction: Secondary | ICD-10-CM | POA: Diagnosis not present

## 2023-05-09 DIAGNOSIS — M6281 Muscle weakness (generalized): Secondary | ICD-10-CM | POA: Diagnosis not present

## 2023-05-11 DIAGNOSIS — M6281 Muscle weakness (generalized): Secondary | ICD-10-CM | POA: Diagnosis not present

## 2023-05-11 DIAGNOSIS — I6930 Unspecified sequelae of cerebral infarction: Secondary | ICD-10-CM | POA: Diagnosis not present

## 2023-05-18 DIAGNOSIS — I6930 Unspecified sequelae of cerebral infarction: Secondary | ICD-10-CM | POA: Diagnosis not present

## 2023-05-18 DIAGNOSIS — M6281 Muscle weakness (generalized): Secondary | ICD-10-CM | POA: Diagnosis not present

## 2023-05-23 DIAGNOSIS — M6281 Muscle weakness (generalized): Secondary | ICD-10-CM | POA: Diagnosis not present

## 2023-05-23 DIAGNOSIS — I6930 Unspecified sequelae of cerebral infarction: Secondary | ICD-10-CM | POA: Diagnosis not present

## 2023-05-25 DIAGNOSIS — I6930 Unspecified sequelae of cerebral infarction: Secondary | ICD-10-CM | POA: Diagnosis not present

## 2023-05-25 DIAGNOSIS — M6281 Muscle weakness (generalized): Secondary | ICD-10-CM | POA: Diagnosis not present

## 2023-05-30 DIAGNOSIS — M6281 Muscle weakness (generalized): Secondary | ICD-10-CM | POA: Diagnosis not present

## 2023-05-30 DIAGNOSIS — I6930 Unspecified sequelae of cerebral infarction: Secondary | ICD-10-CM | POA: Diagnosis not present

## 2023-06-01 DIAGNOSIS — M6281 Muscle weakness (generalized): Secondary | ICD-10-CM | POA: Diagnosis not present

## 2023-06-01 DIAGNOSIS — I6930 Unspecified sequelae of cerebral infarction: Secondary | ICD-10-CM | POA: Diagnosis not present

## 2023-06-06 DIAGNOSIS — M6281 Muscle weakness (generalized): Secondary | ICD-10-CM | POA: Diagnosis not present

## 2023-06-06 DIAGNOSIS — I6930 Unspecified sequelae of cerebral infarction: Secondary | ICD-10-CM | POA: Diagnosis not present

## 2023-06-08 DIAGNOSIS — I6930 Unspecified sequelae of cerebral infarction: Secondary | ICD-10-CM | POA: Diagnosis not present

## 2023-06-08 DIAGNOSIS — M6281 Muscle weakness (generalized): Secondary | ICD-10-CM | POA: Diagnosis not present

## 2023-06-13 DIAGNOSIS — I6930 Unspecified sequelae of cerebral infarction: Secondary | ICD-10-CM | POA: Diagnosis not present

## 2023-06-13 DIAGNOSIS — M6281 Muscle weakness (generalized): Secondary | ICD-10-CM | POA: Diagnosis not present

## 2023-06-15 DIAGNOSIS — M6281 Muscle weakness (generalized): Secondary | ICD-10-CM | POA: Diagnosis not present

## 2023-06-15 DIAGNOSIS — I6930 Unspecified sequelae of cerebral infarction: Secondary | ICD-10-CM | POA: Diagnosis not present

## 2023-06-20 DIAGNOSIS — M6281 Muscle weakness (generalized): Secondary | ICD-10-CM | POA: Diagnosis not present

## 2023-06-20 DIAGNOSIS — I6930 Unspecified sequelae of cerebral infarction: Secondary | ICD-10-CM | POA: Diagnosis not present

## 2023-06-22 DIAGNOSIS — M6281 Muscle weakness (generalized): Secondary | ICD-10-CM | POA: Diagnosis not present

## 2023-06-22 DIAGNOSIS — I6930 Unspecified sequelae of cerebral infarction: Secondary | ICD-10-CM | POA: Diagnosis not present

## 2023-06-27 DIAGNOSIS — I6930 Unspecified sequelae of cerebral infarction: Secondary | ICD-10-CM | POA: Diagnosis not present

## 2023-06-27 DIAGNOSIS — M6281 Muscle weakness (generalized): Secondary | ICD-10-CM | POA: Diagnosis not present

## 2023-06-29 DIAGNOSIS — M6281 Muscle weakness (generalized): Secondary | ICD-10-CM | POA: Diagnosis not present

## 2023-06-29 DIAGNOSIS — I6930 Unspecified sequelae of cerebral infarction: Secondary | ICD-10-CM | POA: Diagnosis not present

## 2023-07-04 DIAGNOSIS — M6281 Muscle weakness (generalized): Secondary | ICD-10-CM | POA: Diagnosis not present

## 2023-07-04 DIAGNOSIS — I6930 Unspecified sequelae of cerebral infarction: Secondary | ICD-10-CM | POA: Diagnosis not present

## 2023-07-06 DIAGNOSIS — I6930 Unspecified sequelae of cerebral infarction: Secondary | ICD-10-CM | POA: Diagnosis not present

## 2023-07-06 DIAGNOSIS — M6281 Muscle weakness (generalized): Secondary | ICD-10-CM | POA: Diagnosis not present

## 2023-07-11 DIAGNOSIS — I6930 Unspecified sequelae of cerebral infarction: Secondary | ICD-10-CM | POA: Diagnosis not present

## 2023-07-11 DIAGNOSIS — M6281 Muscle weakness (generalized): Secondary | ICD-10-CM | POA: Diagnosis not present

## 2023-07-13 DIAGNOSIS — M6281 Muscle weakness (generalized): Secondary | ICD-10-CM | POA: Diagnosis not present

## 2023-07-13 DIAGNOSIS — I6930 Unspecified sequelae of cerebral infarction: Secondary | ICD-10-CM | POA: Diagnosis not present

## 2023-07-18 ENCOUNTER — Inpatient Hospital Stay (HOSPITAL_COMMUNITY)
Admission: EM | Admit: 2023-07-18 | Discharge: 2023-07-25 | DRG: 184 | Disposition: A | Discharge status: Skilled Nursing Facility | Attending: General Surgery | Admitting: General Surgery

## 2023-07-18 ENCOUNTER — Emergency Department (HOSPITAL_COMMUNITY)

## 2023-07-18 ENCOUNTER — Encounter (HOSPITAL_COMMUNITY): Payer: Self-pay

## 2023-07-18 DIAGNOSIS — F419 Anxiety disorder, unspecified: Secondary | ICD-10-CM | POA: Diagnosis not present

## 2023-07-18 DIAGNOSIS — F32A Depression, unspecified: Secondary | ICD-10-CM | POA: Diagnosis present

## 2023-07-18 DIAGNOSIS — I10 Essential (primary) hypertension: Secondary | ICD-10-CM | POA: Diagnosis not present

## 2023-07-18 DIAGNOSIS — R531 Weakness: Secondary | ICD-10-CM | POA: Diagnosis not present

## 2023-07-18 DIAGNOSIS — R296 Repeated falls: Secondary | ICD-10-CM | POA: Diagnosis present

## 2023-07-18 DIAGNOSIS — Z8673 Personal history of transient ischemic attack (TIA), and cerebral infarction without residual deficits: Secondary | ICD-10-CM | POA: Diagnosis not present

## 2023-07-18 DIAGNOSIS — Z7902 Long term (current) use of antithrombotics/antiplatelets: Secondary | ICD-10-CM | POA: Diagnosis not present

## 2023-07-18 DIAGNOSIS — Z7901 Long term (current) use of anticoagulants: Secondary | ICD-10-CM | POA: Diagnosis not present

## 2023-07-18 DIAGNOSIS — N179 Acute kidney failure, unspecified: Secondary | ICD-10-CM | POA: Diagnosis present

## 2023-07-18 DIAGNOSIS — M549 Dorsalgia, unspecified: Secondary | ICD-10-CM | POA: Diagnosis not present

## 2023-07-18 DIAGNOSIS — R0789 Other chest pain: Secondary | ICD-10-CM | POA: Diagnosis not present

## 2023-07-18 DIAGNOSIS — E7800 Pure hypercholesterolemia, unspecified: Secondary | ICD-10-CM | POA: Diagnosis not present

## 2023-07-18 DIAGNOSIS — F429 Obsessive-compulsive disorder, unspecified: Secondary | ICD-10-CM | POA: Diagnosis present

## 2023-07-18 DIAGNOSIS — Z7401 Bed confinement status: Secondary | ICD-10-CM | POA: Diagnosis not present

## 2023-07-18 DIAGNOSIS — R29890 Loss of height: Secondary | ICD-10-CM | POA: Diagnosis not present

## 2023-07-18 DIAGNOSIS — W19XXXA Unspecified fall, initial encounter: Secondary | ICD-10-CM | POA: Diagnosis not present

## 2023-07-18 DIAGNOSIS — K219 Gastro-esophageal reflux disease without esophagitis: Secondary | ICD-10-CM | POA: Diagnosis present

## 2023-07-18 DIAGNOSIS — M85812 Other specified disorders of bone density and structure, left shoulder: Secondary | ICD-10-CM | POA: Diagnosis not present

## 2023-07-18 DIAGNOSIS — R079 Chest pain, unspecified: Secondary | ICD-10-CM

## 2023-07-18 DIAGNOSIS — Z87891 Personal history of nicotine dependence: Secondary | ICD-10-CM | POA: Diagnosis not present

## 2023-07-18 DIAGNOSIS — Z66 Do not resuscitate: Secondary | ICD-10-CM | POA: Diagnosis present

## 2023-07-18 DIAGNOSIS — S0990XA Unspecified injury of head, initial encounter: Secondary | ICD-10-CM | POA: Diagnosis not present

## 2023-07-18 DIAGNOSIS — Z885 Allergy status to narcotic agent status: Secondary | ICD-10-CM

## 2023-07-18 DIAGNOSIS — M47816 Spondylosis without myelopathy or radiculopathy, lumbar region: Secondary | ICD-10-CM | POA: Diagnosis not present

## 2023-07-18 DIAGNOSIS — W1830XA Fall on same level, unspecified, initial encounter: Secondary | ICD-10-CM | POA: Diagnosis present

## 2023-07-18 DIAGNOSIS — R269 Unspecified abnormalities of gait and mobility: Secondary | ICD-10-CM | POA: Diagnosis not present

## 2023-07-18 DIAGNOSIS — I700 Atherosclerosis of aorta: Secondary | ICD-10-CM | POA: Diagnosis not present

## 2023-07-18 DIAGNOSIS — J929 Pleural plaque without asbestos: Secondary | ICD-10-CM | POA: Diagnosis not present

## 2023-07-18 DIAGNOSIS — N178 Other acute kidney failure: Secondary | ICD-10-CM | POA: Diagnosis not present

## 2023-07-18 DIAGNOSIS — S2242XA Multiple fractures of ribs, left side, initial encounter for closed fracture: Secondary | ICD-10-CM | POA: Diagnosis present

## 2023-07-18 DIAGNOSIS — Z79899 Other long term (current) drug therapy: Secondary | ICD-10-CM | POA: Diagnosis not present

## 2023-07-18 DIAGNOSIS — M810 Age-related osteoporosis without current pathological fracture: Secondary | ICD-10-CM | POA: Diagnosis not present

## 2023-07-18 DIAGNOSIS — F329 Major depressive disorder, single episode, unspecified: Secondary | ICD-10-CM | POA: Diagnosis not present

## 2023-07-18 DIAGNOSIS — Y92007 Garden or yard of unspecified non-institutional (private) residence as the place of occurrence of the external cause: Secondary | ICD-10-CM

## 2023-07-18 DIAGNOSIS — E559 Vitamin D deficiency, unspecified: Secondary | ICD-10-CM | POA: Diagnosis not present

## 2023-07-18 DIAGNOSIS — S199XXA Unspecified injury of neck, initial encounter: Secondary | ICD-10-CM | POA: Diagnosis not present

## 2023-07-18 DIAGNOSIS — M25512 Pain in left shoulder: Secondary | ICD-10-CM | POA: Diagnosis not present

## 2023-07-18 DIAGNOSIS — S3992XA Unspecified injury of lower back, initial encounter: Secondary | ICD-10-CM | POA: Diagnosis not present

## 2023-07-18 DIAGNOSIS — Z7982 Long term (current) use of aspirin: Secondary | ICD-10-CM

## 2023-07-18 DIAGNOSIS — S22080D Wedge compression fracture of T11-T12 vertebra, subsequent encounter for fracture with routine healing: Secondary | ICD-10-CM | POA: Diagnosis not present

## 2023-07-18 DIAGNOSIS — S22089A Unspecified fracture of T11-T12 vertebra, initial encounter for closed fracture: Secondary | ICD-10-CM | POA: Diagnosis present

## 2023-07-18 DIAGNOSIS — Z9181 History of falling: Secondary | ICD-10-CM

## 2023-07-18 DIAGNOSIS — I771 Stricture of artery: Secondary | ICD-10-CM | POA: Diagnosis not present

## 2023-07-18 DIAGNOSIS — E785 Hyperlipidemia, unspecified: Secondary | ICD-10-CM | POA: Diagnosis not present

## 2023-07-18 DIAGNOSIS — S22080A Wedge compression fracture of T11-T12 vertebra, initial encounter for closed fracture: Secondary | ICD-10-CM | POA: Diagnosis not present

## 2023-07-18 DIAGNOSIS — Z888 Allergy status to other drugs, medicaments and biological substances status: Secondary | ICD-10-CM

## 2023-07-18 DIAGNOSIS — R0781 Pleurodynia: Secondary | ICD-10-CM | POA: Diagnosis not present

## 2023-07-18 DIAGNOSIS — M546 Pain in thoracic spine: Secondary | ICD-10-CM | POA: Diagnosis not present

## 2023-07-18 DIAGNOSIS — F411 Generalized anxiety disorder: Secondary | ICD-10-CM | POA: Diagnosis not present

## 2023-07-18 DIAGNOSIS — I6782 Cerebral ischemia: Secondary | ICD-10-CM | POA: Diagnosis not present

## 2023-07-18 DIAGNOSIS — J90 Pleural effusion, not elsewhere classified: Secondary | ICD-10-CM | POA: Diagnosis not present

## 2023-07-18 DIAGNOSIS — H04123 Dry eye syndrome of bilateral lacrimal glands: Secondary | ICD-10-CM | POA: Diagnosis not present

## 2023-07-18 DIAGNOSIS — M40204 Unspecified kyphosis, thoracic region: Secondary | ICD-10-CM | POA: Diagnosis not present

## 2023-07-18 DIAGNOSIS — S2242XD Multiple fractures of ribs, left side, subsequent encounter for fracture with routine healing: Secondary | ICD-10-CM | POA: Diagnosis not present

## 2023-07-18 DIAGNOSIS — S22088A Other fracture of T11-T12 vertebra, initial encounter for closed fracture: Secondary | ICD-10-CM | POA: Diagnosis not present

## 2023-07-18 HISTORY — DX: Gastro-esophageal reflux disease without esophagitis: K21.9

## 2023-07-18 LAB — COMPREHENSIVE METABOLIC PANEL WITH GFR
ALT: 20 U/L (ref 0–44)
AST: 19 U/L (ref 15–41)
Albumin: 4.4 g/dL (ref 3.5–5.0)
Alkaline Phosphatase: 91 U/L (ref 38–126)
Anion gap: 12 (ref 5–15)
BUN: 20 mg/dL (ref 8–23)
CO2: 25 mmol/L (ref 22–32)
Calcium: 9.3 mg/dL (ref 8.9–10.3)
Chloride: 101 mmol/L (ref 98–111)
Creatinine, Ser: 0.51 mg/dL (ref 0.44–1.00)
GFR, Estimated: >60 mL/min (ref >60)
Glucose, Bld: 124 mg/dL — ABNORMAL HIGH (ref 70–99)
Potassium: 3.5 mmol/L (ref 3.5–5.1)
Sodium: 138 mmol/L (ref 135–145)
Total Bilirubin: 0.9 mg/dL (ref 0.0–1.2)
Total Protein: 7.7 g/dL (ref 6.5–8.1)

## 2023-07-18 LAB — URINALYSIS, ROUTINE W REFLEX MICROSCOPIC
Bilirubin Urine: NEGATIVE
Glucose, UA: NEGATIVE mg/dL
Hgb urine dipstick: NEGATIVE
Ketones, ur: 20 mg/dL — AB
Nitrite: NEGATIVE
Protein, ur: NEGATIVE mg/dL
Specific Gravity, Urine: >1.046 — ABNORMAL HIGH (ref 1.005–1.030)
WBC, UA: >50 WBC/hpf (ref 0–5)
pH: 5 (ref 5.0–8.0)

## 2023-07-18 LAB — CBC
HCT: 46.2 % — ABNORMAL HIGH (ref 36.0–46.0)
Hemoglobin: 15.1 g/dL — ABNORMAL HIGH (ref 12.0–15.0)
MCH: 32 pg (ref 26.0–34.0)
MCHC: 32.7 g/dL (ref 30.0–36.0)
MCV: 97.9 fL (ref 80.0–100.0)
Platelets: 236 10*3/uL (ref 150–400)
RBC: 4.72 MIL/uL (ref 3.87–5.11)
RDW: 12.7 % (ref 11.5–15.5)
WBC: 14.2 10*3/uL — ABNORMAL HIGH (ref 4.0–10.5)
nRBC: 0 % (ref 0.0–0.2)

## 2023-07-18 MED ORDER — SODIUM CHLORIDE 0.9 % IV SOLN: INTRAVENOUS | Status: AC

## 2023-07-18 MED ORDER — METOPROLOL TARTRATE 5 MG/5ML IV SOLN: 5.0000 mg | Freq: Four times a day (QID) | INTRAVENOUS | Status: DC | PRN

## 2023-07-18 MED ORDER — ACETAMINOPHEN 500 MG PO TABS
1000.0000 mg | ORAL_TABLET | Freq: Four times a day (QID) | ORAL | Status: DC
  Administered 2023-07-19 – 2023-07-25 (×22): 1000 mg via ORAL
  Filled 2023-07-18 (×25): qty 2

## 2023-07-18 MED ORDER — IBUPROFEN 200 MG PO TABS
600.0000 mg | ORAL_TABLET | Freq: Three times a day (TID) | ORAL | Status: DC
  Administered 2023-07-18: 600 mg via ORAL
  Filled 2023-07-18: qty 3

## 2023-07-18 MED ORDER — MORPHINE SULFATE (PF) 2 MG/ML IV SOLN
1.0000 mg | INTRAVENOUS | Status: DC | PRN
  Administered 2023-07-18 – 2023-07-19 (×2): 1 mg via INTRAVENOUS
  Filled 2023-07-18 (×2): qty 1

## 2023-07-18 MED ORDER — VITAMIN D 25 MCG (1000 UNIT) PO TABS
1000.0000 [IU] | ORAL_TABLET | Freq: Every morning | ORAL | Status: DC
  Administered 2023-07-19 – 2023-07-25 (×7): 1000 [IU] via ORAL
  Filled 2023-07-18 (×7): qty 1

## 2023-07-18 MED ORDER — METHOCARBAMOL 500 MG PO TABS
500.0000 mg | ORAL_TABLET | Freq: Three times a day (TID) | ORAL | Status: AC
  Administered 2023-07-18 – 2023-07-21 (×9): 500 mg via ORAL
  Filled 2023-07-18 (×9): qty 1

## 2023-07-18 MED ORDER — ONDANSETRON 4 MG PO TBDP: 4.0000 mg | ORAL_TABLET | Freq: Four times a day (QID) | ORAL | Status: DC | PRN

## 2023-07-18 MED ORDER — ENOXAPARIN SODIUM 30 MG/0.3ML IJ SOSY
30.0000 mg | PREFILLED_SYRINGE | Freq: Two times a day (BID) | INTRAMUSCULAR | Status: DC
  Administered 2023-07-19 – 2023-07-25 (×13): 30 mg via SUBCUTANEOUS
  Filled 2023-07-18 (×13): qty 0.3

## 2023-07-18 MED ORDER — POLYVINYL ALCOHOL 1.4 % OP SOLN
1.0000 [drp] | OPHTHALMIC | Status: DC | PRN
Start: 2023-07-18 — End: 2023-07-25

## 2023-07-18 MED ORDER — HYDRALAZINE HCL 20 MG/ML IJ SOLN: 10.0000 mg | INTRAMUSCULAR | Status: DC | PRN

## 2023-07-18 MED ORDER — MORPHINE SULFATE (PF) 2 MG/ML IV SOLN
2.0000 mg | Freq: Once | INTRAVENOUS | Status: AC
  Administered 2023-07-18: 2 mg via INTRAVENOUS
  Filled 2023-07-18: qty 1

## 2023-07-18 MED ORDER — IOHEXOL 300 MG/ML  SOLN
75.0000 mL | Freq: Once | INTRAMUSCULAR | Status: AC | PRN
  Administered 2023-07-18: 75 mL via INTRAVENOUS

## 2023-07-18 MED ORDER — POLYETHYLENE GLYCOL 3350 17 G PO PACK: 17.0000 g | PACK | Freq: Every day | ORAL | Status: DC | PRN

## 2023-07-18 MED ORDER — TRAMADOL HCL 50 MG PO TABS: 25.0000 mg | ORAL_TABLET | Freq: Four times a day (QID) | ORAL | Status: DC | PRN

## 2023-07-18 MED ORDER — PANTOPRAZOLE SODIUM 40 MG PO TBEC
40.0000 mg | DELAYED_RELEASE_TABLET | Freq: Every day | ORAL | Status: DC
  Administered 2023-07-19 – 2023-07-25 (×7): 40 mg via ORAL
  Filled 2023-07-18 (×7): qty 1

## 2023-07-18 MED ORDER — METHOCARBAMOL 1000 MG/10ML IJ SOLN
500.0000 mg | Freq: Three times a day (TID) | INTRAMUSCULAR | Status: AC
  Filled 2023-07-18: qty 10

## 2023-07-18 MED ORDER — ESCITALOPRAM OXALATE 10 MG PO TABS
10.0000 mg | ORAL_TABLET | Freq: Every evening | ORAL | Status: DC
  Administered 2023-07-18 – 2023-07-24 (×7): 10 mg via ORAL
  Filled 2023-07-18 (×7): qty 1

## 2023-07-18 MED ORDER — DOCUSATE SODIUM 100 MG PO CAPS
100.0000 mg | ORAL_CAPSULE | Freq: Two times a day (BID) | ORAL | Status: DC
  Administered 2023-07-18 – 2023-07-24 (×10): 100 mg via ORAL
  Filled 2023-07-18 (×13): qty 1

## 2023-07-18 MED ORDER — TRAMADOL HCL 50 MG PO TABS
50.0000 mg | ORAL_TABLET | Freq: Four times a day (QID) | ORAL | Status: DC | PRN
  Administered 2023-07-19 – 2023-07-24 (×6): 50 mg via ORAL
  Filled 2023-07-18 (×6): qty 1

## 2023-07-18 MED ORDER — POLYETHYL GLYCOL-PROPYL GLYCOL 0.4-0.3 % OP GEL: Freq: Every day | OPHTHALMIC | Status: DC | PRN

## 2023-07-18 MED ORDER — ONDANSETRON HCL 4 MG/2ML IJ SOLN
4.0000 mg | Freq: Four times a day (QID) | INTRAMUSCULAR | Status: DC | PRN
Start: 2023-07-18 — End: 2023-07-25

## 2023-07-18 MED ORDER — PANTOPRAZOLE SODIUM 40 MG IV SOLR
40.0000 mg | Freq: Every day | INTRAVENOUS | Status: DC
Start: 2023-07-19 — End: 2023-07-20
  Filled 2023-07-18 (×2): qty 10

## 2023-07-18 MED ORDER — MELATONIN 3 MG PO TABS
3.0000 mg | ORAL_TABLET | Freq: Every evening | ORAL | Status: DC | PRN
  Administered 2023-07-18 – 2023-07-21 (×4): 3 mg via ORAL
  Filled 2023-07-18 (×5): qty 1

## 2023-07-18 MED ORDER — SIMVASTATIN 20 MG PO TABS
10.0000 mg | ORAL_TABLET | Freq: Every day | ORAL | Status: DC
  Administered 2023-07-19 – 2023-07-24 (×6): 10 mg via ORAL
  Filled 2023-07-18 (×6): qty 1

## 2023-07-18 NOTE — ED Notes (Signed)
 [facility] called and should be here in 15 mins. Charge aware.

## 2023-07-18 NOTE — ED Notes (Signed)
 Patient left with [facility] at this time after being assisted to used bedpan before leaving.

## 2023-07-18 NOTE — ED Notes (Addendum)
 Per day shift RN patient TLSO brace is to be applied at [place].

## 2023-07-18 NOTE — ED Triage Notes (Signed)
 Pt coming from home due to weakness and falls. Pt. States pain in her left shoulder, arm, rib and back area. Unable to move left arm. Unable to get out of bed this morning. Pt. States it hurts to breath. EMS states lung sounds diminished on the left side.  A&O x4.  BP 192/120 HR 74 O2 94% on room air  CBG 129  Temp 98.4

## 2023-07-18 NOTE — ED Notes (Signed)
 Patient being transported to [facility] per Ortho they will apply back brace

## 2023-07-18 NOTE — ED Provider Notes (Addendum)
 Signed out to check CT results and admit.    CT with multiple (5) left rib fractures, which corresponds to patients are of pain.  Also noted with T12 compression fracture  - pt does have some pain to that region, but no severe or uncontrolled back pain (pt notes was told two prior spine fractures from prior fall pt is relatively poor historian and is not sure when that was, what level, etc. Denies any leg numbness/weakness or radicular pain).   Discussed imaging w pt.   Will consult surgery re multiple rib fxs/elderly patient.  Discussed pt with general surgery on call, Dr Elvan Hamel- he indicates he will see and admit to trauma team at [facility], requests we also consult NS re back fx. NS consulted.    Discussed pt, recommends TLSO brace, no need for MRI currently, and they will see if admitted at [facility].   Additional pain meds provided. Incentive spirometer.       Selena Lee, MD 07/18/23 [phone]

## 2023-07-18 NOTE — Progress Notes (Signed)
 Orthopedic Tech Progress Note Patient Details:  Selena Morris 02/17/1946 [id number]  Ortho Devices Type of Ortho Device: Thoracolumbar corset (TLSO) Ortho Device/Splint Interventions: Ordered, Adjustment   Post Interventions Instructions Provided: Care of device, Adjustment of device TLSO adjusted and left at bedside for OOB use. Toi Foster 07/18/2023, 9:55 PM

## 2023-07-18 NOTE — H&P (Addendum)
 CC: I have been falling  Requesting provider: Dr Ephraim Hash  HPI: Selena Morris is an 78 y.o. female who is here for evaluation after suffering several falls over the past few days.  History is provided by the patient and her son.  Patient reports falling on Thursday, Friday, Sunday and yesterday.  She fell Thursday while taking the trash out and trying to lift the gate with the other hand.  She has a remote history of stroke from a few years ago.  Past medical history also includes hypertension, hypercholesterolemia-not on any blood pressure medication per patient.  After falling yesterday she experienced worsening pain.  She had been managing her pain from her recent falls with 2 tablets of Aleve twice a day.  No blood thinners.  She does report frequent urination and incontinence over the past couple of days.  She denies any dysuria or hematuria.  She denies any lightheadedness or dizziness preceding the falls.  She states that she has had balance issues since her stroke.  She has not seen a neurologist as an outpatient.  Her PCP is Dr. Hazeline Lister with [facility] denies any abdominal pain, bilateral extremity pain, bilateral upper extremity pain, central neck pain, jaw pain, blurry vision.  SHe endorses central back pain without radiculopathy as well as some left lateral chest wall pain.  She was evaluated and worked up by the emergency room and found to have multiple rib fractures as well as a T12 compression fracture and we were called for evaluation and probable admission.  ER already spoke with neurosurgery who recommended starting management of her T12 compression fraction with a TLSO brace    Past Medical History:  Diagnosis Date   ADD (attention deficit disorder)    Anxiety    Arthritis    Conjunctivitis    Depression    GERD (gastroesophageal reflux disease)    Hyperlipemia    Hypertension    no bp meds last year   OCD (obsessive compulsive disorder)     Osteoporosis     Past Surgical History:  Procedure Laterality Date   ABDOMINAL HYSTERECTOMY     BREAST LUMPECTOMY Left    COLONOSCOPY WITH PROPOFOL  N/A 06/02/2014   Procedure: COLONOSCOPY WITH PROPOFOL ;  Surgeon: Garrett Kallman, MD;  Location: [facility];  Service: Endoscopy;  Laterality: N/A;    Family History  Family history unknown: Yes    Social:  reports that she quit smoking about 39 years ago. Her smoking use included cigarettes. She started smoking about 59 years ago. She has a 20 pack-year smoking history. She has never used smokeless tobacco. She reports current alcohol use of about 21.0 standard drinks of alcohol per week. She reports that she does not use drugs.  Allergies:  Allergies  Allergen Reactions   Percocet [Oxycodone-Acetaminophen ] Rash   Tylox [Oxycodone-Acetaminophen ] Rash    Rash=tylox    Medications: I have reviewed the patient's current medications.   ROS - all of the below systems have been reviewed with the patient and positives are indicated with bold text General: chills, fever or night sweats Eyes: blurry vision or double vision ENT: epistaxis or sore throat Allergy/Immunology: itchy/watery eyes or nasal congestion Hematologic/Lymphatic: bleeding problems, blood clots or swollen lymph nodes Endocrine: temperature intolerance or unexpected weight changes Breast: new or changing breast lumps or nipple discharge Resp: cough, shortness of breath, or wheezing CV: chest pain or dyspnea on exertion GI: as per HPI GU: dysuria, trouble voiding, or hematuria  MSK: joint pain or joint stiffness; see HPI Neuro: TIA or stroke symptoms; see HPI Derm: pruritus and skin lesion changes Psych: anxiety and depression  PE Blood pressure (!) 161/85, pulse 69, temperature 98.4 F (36.9 C), resp. rate (!) 30, height 5' (1.524 m), weight 59 kg, SpO2 93%. Constitutional: NAD; conversant; no deformities Eyes: Moist conjunctiva; no lid lag; anicteric;  PERRL Face: No bony tenderness Neck: Trachea midline; no thyromegaly Lungs: Normal respiratory effort; no tactile fremitus Chest: No flail component, tender to palpation left lateral chest CV: RRR; no palpable thrills; no pitting edema GI: Abd soft, nt, nd; no palpable hepatosplenomegaly MSK:  no clubbing/cyanosis; no palpable deformity or bony tenderness to palpation of bilateral upper extremity, bilateral lower extremity Psychiatric: Appropriate affect; alert and oriented x3 Lymphatic: No palpable cervical or axillary lymphadenopathy Skin: No obvious rash, or lesions Neuro: GCS 15, moves all extremities, neurovascularly intact, tongue midline gross sensation intact  Results for orders placed or performed during the hospital encounter of 07/18/23 (from the past 48 hours)  CBC     Status: Abnormal   Collection Time: 07/18/23  1:10 PM  Result Value Ref Range   WBC 14.2 (H) 4.0 - 10.5 K/uL   RBC 4.72 3.87 - 5.11 MIL/uL   Hemoglobin 15.1 (H) 12.0 - 15.0 g/dL   HCT 16.1 (H) 09.6 - 04.5 %   MCV 97.9 80.0 - 100.0 fL   MCH 32.0 26.0 - 34.0 pg   MCHC 32.7 30.0 - 36.0 g/dL   RDW 40.9 81.1 - 91.4 %   Platelets 236 150 - 400 K/uL   nRBC 0.0 0.0 - 0.2 %    Comment: Performed at [facility], [address] [place], [place] [address]  Comprehensive metabolic panel     Status: Abnormal   Collection Time: 07/18/23  1:10 PM  Result Value Ref Range   Sodium 138 135 - 145 mmol/L   Potassium 3.5 3.5 - 5.1 mmol/L   Chloride 101 98 - 111 mmol/L   CO2 25 22 - 32 mmol/L   Glucose, Bld 124 (H) 70 - 99 mg/dL    Comment: Glucose reference range applies only to samples taken after fasting for at least 8 hours.   BUN 20 8 - 23 mg/dL   Creatinine, Ser 6.21 0.44 - 1.00 mg/dL   Calcium 9.3 8.9 - 30.8 mg/dL   Total Protein 7.7 6.5 - 8.1 g/dL   Albumin 4.4 3.5 - 5.0 g/dL   AST 19 15 - 41 U/L   ALT 20 0 - 44 U/L   Alkaline Phosphatase 91 38 - 126 U/L   Total Bilirubin 0.9 0.0 - 1.2  mg/dL   GFR, Estimated >65 >78 mL/min    Comment: (NOTE) Calculated using the CKD-EPI Creatinine Equation (2021)    Anion gap 12 5 - 15    Comment: Performed at [facility] [address] [place], [place] [address]    CT Lumbar Spine Wo Contrast Result Date: 07/18/2023 CLINICAL DATA:  Back trauma, no prior imaging (Age >= 16y). Weakness and falls. Back pain. EXAM: CT LUMBAR SPINE WITHOUT CONTRAST TECHNIQUE: Multidetector CT imaging of the lumbar spine was performed without intravenous contrast administration. Multiplanar CT image reconstructions were also generated. RADIATION DOSE REDUCTION: This exam was performed according to the departmental dose-optimization program which includes automated exposure control, adjustment of the mA and/or kV according to patient size and/or use of iterative reconstruction technique. COMPARISON:  Lumbar spine CT 12/17/2021 FINDINGS: Segmentation:  There are 5 non-rib-bearing lumbar type vertebrae and 13 rib-bearing thoracic vertebrae. Alignment: Unchanged trace anterolisthesis of L4 on L5. Slightly exaggerated lumbar lordosis. Vertebrae: No acute fracture or suspicious lesion. Paraspinal and other soft tissues: Abdominal aortic atherosclerosis without aneurysm. Disc levels: Mild spondylosis with preserved disc heights throughout the lumbar spine. Diffuse facet arthrosis which is severe at L4-5 and L5-S1. No evidence of high-grade spinal canal or neural foraminal stenosis. IMPRESSION: 1. No acute lumbar spine fracture. 2. Severe facet arthrosis at L4-5 and L5-S1. 3.  Aortic Atherosclerosis (ICD10-I70.0). Electronically Signed   By: Aundra Lee M.D.   On: 07/18/2023 16:20   CT T-SPINE NO CHARGE Result Date: 07/18/2023 CLINICAL DATA:  Trauma.  Weakness and falls.  Back pain. EXAM: CT THORACIC SPINE WITH CONTRAST TECHNIQUE: Multiplanar CT images of the thoracic spine were reconstructed from contemporary CT of the Chest. RADIATION DOSE REDUCTION: This  exam was performed according to the departmental dose-optimization program which includes automated exposure control, adjustment of the mA and/or kV according to patient size and/or use of iterative reconstruction technique. CONTRAST:  No additional. COMPARISON:  CT chest 12/17/2021 FINDINGS: Alignment: Mildly exaggerated thoracic kyphosis. No significant listhesis. Vertebrae: T12 superior endplate compression fracture with 55% vertebral body height loss anteriorly, new from 2023. Associated sclerosis without a visible acute appearing fracture line or paraspinal hematoma. 3-4 mm retropulsion of the posterosuperior T12 vertebral body. No posterior element fracture. No suspicious bone lesion. Left-sided rib fractures as described on today's separate chest CT. Paraspinal and other soft tissues: No acute abnormality in the paraspinal soft tissues. Remainder of the chest reported separately. Disc levels: Predominantly mild multilevel disc degeneration and up to moderate facet arthrosis. Mild spinal stenosis at T11-12 due to T12 retropulsion. IMPRESSION: T12 compression fracture with 55% height loss, new from 2023 but otherwise age indeterminate and favored to be subacute or chronic. Correlate with point tenderness and consider obtaining a spine MRI if there is clinical concern for a recent fracture. Slight retropulsion with mild spinal stenosis. Electronically Signed   By: Aundra Lee M.D.   On: 07/18/2023 16:16   CT Head Wo Contrast Result Date: 07/18/2023 CLINICAL DATA:  Head trauma, minor (Age >= 65y); Neck trauma (Age >= 65y). Weakness and falls. Left shoulder and arm pain. EXAM: CT HEAD WITHOUT CONTRAST CT CERVICAL SPINE WITHOUT CONTRAST TECHNIQUE: Multidetector CT imaging of the head and cervical spine was performed following the standard protocol without intravenous contrast. Multiplanar CT image reconstructions of the cervical spine were also generated. RADIATION DOSE REDUCTION: This exam was performed  according to the departmental dose-optimization program which includes automated exposure control, adjustment of the mA and/or kV according to patient size and/or use of iterative reconstruction technique. COMPARISON:  CT head and cervical spine 12/17/2021 FINDINGS: CT HEAD FINDINGS Brain: There is no evidence of an acute cortically based infarct, intracranial hemorrhage, mass, midline shift, or extra-axial fluid collection. Confluent hypodensities in the cerebral white matter bilaterally and heterogeneous hypodensities in the basal ganglia are nonspecific but compatible with severe chronic small vessel ischemic disease. 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 Mild cerebral atrophy is within normal limits for age. Vascular: Calcified atherosclerosis at the skull base. No hyperdense vessel. Skull: No acute calvarial fracture or suspicious lesion. Sinuses/Orbits: Complete opacification of the left frontal sinus and anterior and mid left ethmoid air cells. Partially visualized hyperdense fluid in the left maxillary sinus. Clear mastoid air cells. Bilateral cataract extraction. Other: None. CT CERVICAL  SPINE FINDINGS Alignment: Unchanged.  No acute traumatic malalignment. Skull base and vertebrae: No acute fracture or suspicious lesion. 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 Soft tissues and spinal canal: No prevertebral fluid or swelling. No visible canal hematoma. Disc levels: Mild disc degeneration and moderate to severe multilevel facet arthrosis. No evidence of high-grade spinal canal stenosis. Advanced right neural foraminal stenosis at C4-5. Upper chest: More fully evaluated on the contemporaneous dedicated chest CT. Other: None. IMPRESSION: 1. No evidence of acute intracranial abnormality or cervical spine fracture. 2. Partially visualized hyperdense fluid/possible hemorrhage in the left maxillary sinus. A maxillofacial CT is recommended if there is any clinical  concern for an acute facial fracture. 3. Severe chronic small vessel ischemic disease. Electronically Signed   By: Aundra Lee M.D.   On: 07/18/2023 16:05   CT Cervical Spine Wo Contrast Result Date: 07/18/2023 CLINICAL DATA:  Head trauma, minor (Age >= 65y); Neck trauma (Age >= 65y). Weakness and falls. Left shoulder and arm pain. EXAM: CT HEAD WITHOUT CONTRAST CT CERVICAL SPINE WITHOUT CONTRAST TECHNIQUE: Multidetector CT imaging of the head and cervical spine was performed following the standard protocol without intravenous contrast. Multiplanar CT image reconstructions of the cervical spine were also generated. RADIATION DOSE REDUCTION: This exam was performed according to the departmental dose-optimization program which includes automated exposure control, adjustment of the mA and/or kV according to patient size and/or use of iterative reconstruction technique. COMPARISON:  CT head and cervical spine 12/17/2021 FINDINGS: CT HEAD FINDINGS Brain: There is no evidence of an acute cortically based infarct, intracranial hemorrhage, mass, midline shift, or extra-axial fluid collection. Confluent hypodensities in the cerebral white matter bilaterally and heterogeneous hypodensities in the basal ganglia are nonspecific but compatible with severe chronic small vessel ischemic disease. 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 Mild cerebral atrophy is within normal limits for age. Vascular: Calcified atherosclerosis at the skull base. No hyperdense vessel. Skull: No acute calvarial fracture or suspicious lesion. Sinuses/Orbits: Complete opacification of the left frontal sinus and anterior and mid left ethmoid air cells. Partially visualized hyperdense fluid in the left maxillary sinus. Clear mastoid air cells. Bilateral cataract extraction. Other: None. CT CERVICAL SPINE FINDINGS Alignment: Unchanged.  No acute traumatic malalignment. Skull base and vertebrae: No acute fracture or  suspicious lesion. 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 Soft tissues and spinal canal: No prevertebral fluid or swelling. No visible canal hematoma. Disc levels: Mild disc degeneration and moderate to severe multilevel facet arthrosis. No evidence of high-grade spinal canal stenosis. Advanced right neural foraminal stenosis at C4-5. Upper chest: More fully evaluated on the contemporaneous dedicated chest CT. Other: None. IMPRESSION: 1. No evidence of acute intracranial abnormality or cervical spine fracture. 2. Partially visualized hyperdense fluid/possible hemorrhage in the left maxillary sinus. A maxillofacial CT is recommended if there is any clinical concern for an acute facial fracture. 3. Severe chronic small vessel ischemic disease. Electronically Signed   By: Aundra Lee M.D.   On: 07/18/2023 16:05   CT Chest W Contrast Result Date: 07/18/2023 CLINICAL DATA:  Chest trauma, blunt. Weakness with multiple falls. Left shoulder, arm, rib and back pain. EXAM: CT CHEST WITH CONTRAST TECHNIQUE: Multidetector CT imaging of the chest was performed during intravenous contrast administration. RADIATION DOSE REDUCTION: This exam was performed according to the departmental dose-optimization program which includes automated exposure control, adjustment of the mA and/or kV according to patient size and/or use of iterative reconstruction technique. CONTRAST:  75mL OMNIPAQUE  IOHEXOL   300 MG/ML  SOLN COMPARISON:  Chest radiographs 07/18/2023.  Chest CT 12/17/2021. FINDINGS: Cardiovascular: No acute vascular findings. There is mild atherosclerosis of the aorta, great vessels and coronary arteries. No evidence of mediastinal hematoma. The heart size is normal. There is no pericardial effusion. Mediastinum/Nodes: There are no enlarged mediastinal, hilar or axillary lymph nodes. The thyroid  gland, trachea and esophagus demonstrate no significant findings. Lungs/Pleura: Trace left pleural effusion. No pneumothorax.  Chronic subpleural reticulation in both lungs has mildly progressed compared with the previous CT. There is mild superimposed atelectasis at both lung bases. No confluent airspace disease or suspicious nodularity. Upper abdomen: No acute findings are seen in the visualized upper abdomen. There is aortic and branch vessel atherosclerosis. Musculoskeletal/Chest wall: Multiple acute fractures of the left ribs laterally, involving the 3rd through 7th ribs. Most of these are nondisplaced. Minimal displacement of the 5th and 6th rib fractures. There are old right-sided rib fractures. There is a new superior endplate compression fracture at T12. Spine details dictated separately. IMPRESSION: 1. Multiple acute fractures of the left ribs laterally, involving the 3rd through 7th ribs. Most of these are nondisplaced. 2. Trace left pleural effusion. No pneumothorax. 3. New superior endplate compression fracture at T12. Spine details dictated separately. 4. No evidence of acute vascular injury or mediastinal hematoma. 5. Chronic subpleural reticulation in both lungs has mildly progressed compared with the previous CT. 6.  Aortic Atherosclerosis (ICD10-I70.0). Electronically Signed   By: Elmon Hagedorn M.D.   On: 07/18/2023 15:56   DG Chest Port 1 View Result Date: 07/18/2023 CLINICAL DATA:  fall, chest pain EXAM: PORTABLE CHEST - 1 VIEW COMPARISON:  November 05, 2015 FINDINGS: Low lung volumes. No focal airspace consolidation, pleural effusion, or pneumothorax. Biapical pleural thickening. No cardiomegaly. Tortuous aorta with aortic atherosclerosis. Remote, right-sided rib fractures. The possible rib fractures on the left are better visualized on the shoulder radiograph. Multilevel thoracic osteophytosis. IMPRESSION: No pneumonia, pulmonary edema, or pneumothorax. Electronically Signed   By: Rance Burrows M.D.   On: 07/18/2023 14:29   DG Shoulder Left Result Date: 07/18/2023 CLINICAL DATA:  fall, chest pain, left shoulder  pain EXAM: LEFT SHOULDER - 2+ VIEW COMPARISON:  None Available. FINDINGS: Osteopenia. No acute fracture or dislocation of the shoulder. A couple of likely mildly displaced left lateral rib fractures are present. Soft tissues are unremarkable. IMPRESSION: A couple of mildly displaced left lateral rib fractures are likely present. Otherwise, no acute fracture or dislocation of the shoulder. Clinical correlation requested. Electronically Signed   By: Rance Burrows M.D.   On: 07/18/2023 14:27    Imaging: Personally reviewed  A/P: Selena Morris is an 78 y.o. female  S/p multiple falls L rib fx 3-7 T12 compression fx Urinary frequency/incontinence H/o CVA Hypertension,  Hypercholesterolemia   Admit to trauma service at [facility] to see regarding her T12 compression fracture and right now they are initial recommendation is a TLSO brace - ED MD discussed with Easter Golden PA PT and OT consults once cleared by neurosurgery Check urinalysis to rule out a UTI Repeat labs in the morning Pain control for rib fractures and back fracture.  I do think the T12 compression fraction is new since she did not have pain in her back till a few days ago Pulmonary toilet Patient may benefit from further outpatient workup regarding her balance issues   I discussed CODE STATUS with the patient and her son.  Patient wants to be DNR.  She does  not want to be resuscitated should she have an acute medical event.  High MDM   Data reviewed: I personally reviewed her CT head, C-spine, lumbar spine, chest, diagnostic x-rays of her chest and left shoulder; labs and vitals, ED notes; ED encounter from September 2024; discussed case with the trauma surgeon at [facility]; discussed case with ED attending  Marianna Shirk. Elvan Hamel, MD, FACS General, Bariatric, & Minimally Invasive Surgery [facility]

## 2023-07-18 NOTE — ED Notes (Signed)
 Patient transported to CT

## 2023-07-18 NOTE — ED Provider Notes (Signed)
 [facility] AT [facility] Provider Note   CSN: [id number] Arrival date & time: 07/18/23  1137     History  Chief Complaint  Patient presents with   Fall   Weakness   Extremity Pain    Pain on the left shoulder, left rib area, and back due to fall.     LOWEN FELLAND is a 78 y.o. female.  HPI 78 year old female history of osteoporosis, hypertension, OCD presents today complaining of generalized weakness and falls.  She states that she has fallen multiple times over the past several days.  She complains of pain in the left side of her chest, left lateral chest wall, and left shoulder.  She states that it is too painful to move her left arm.  She is unable to get out of bed this morning.  Pain is worse with respirations.    Home Medications Prior to Admission medications   Medication Sig Start Date End Date Taking? Authorizing Provider  acetaminophen  (TYLENOL ) 500 MG tablet Take 2 tablets (1,000 mg total) by mouth every 6 (six) hours as needed for up to 60 doses. 12/17/21   Countryman, Chase, MD  Alpha Lipoic Acid 200 MG CAPS Take 1 capsule by mouth every morning.    [provider]  aspirin EC 81 MG tablet Take 81 mg by mouth every morning.     [provider]  b complex vitamins tablet Take 1 tablet by mouth every morning.     [provider]  calcium-vitamin D (OSCAL WITH D) 500-200 MG-UNIT per tablet Take 1 tablet by mouth every morning.     [provider]  cephALEXin  (KEFLEX ) 500 MG capsule Take 1 capsule (500 mg total) by mouth 3 (three) times daily. 11/29/22   Onetha Bile, MD  cholecalciferol (VITAMIN D) 1000 UNITS tablet Take 1,000 Units by mouth every morning.     [provider]  Coenzyme Q10 (COQ-10 PO) Take 500 mg by mouth every morning.     [provider]  escitalopram (LEXAPRO) 20 MG tablet Take 10 mg by mouth every evening.     [provider]  fish oil-omega-3  fatty acids 1000 MG capsule Take 1 g by mouth every morning.     [provider]  HYDROcodone -acetaminophen  (NORCO/VICODIN) 5-325 MG per tablet Take 2 tablets by mouth every 4 (four) hours as needed for pain. Patient not taking: Reported on 05/12/2014 01/22/12   Earma Gloss, MD  ibuprofen  (ADVIL ,MOTRIN ) 600 MG tablet Take 1 tablet (600 mg total) by mouth every 6 (six) hours as needed for pain. Patient not taking: Reported on 05/12/2014 01/22/12   Earma Gloss, MD  Nutritional Supplements (ESTROVEN ENERGY PO) Take 1 tablet by mouth every morning.     [provider]  OVER THE COUNTER MEDICATION Take 1 tablet by mouth every morning. [employer] brand allergy    [provider]  OVER THE COUNTER MEDICATION Take 1 capsule by mouth daily. Buffered C ph control    [provider]  Polyethyl Glycol-Propyl Glycol (SYSTANE OP) Apply 1-2 drops to eye daily as needed (dry eyes, dust.).    [provider]  simvastatin (ZOCOR) 10 MG tablet Take 10 mg by mouth daily at 6 PM.    [provider]      Allergies    Percocet [oxycodone-acetaminophen ] and Tylox [oxycodone-acetaminophen ]    Review of Systems   Review of Systems  Physical Exam Updated Vital Signs BP (!) 163/88  Pulse 77   Temp 98.4 F (36.9 C)   Resp (!) 25   Ht 1.524 m (5')   Wt 59 kg   SpO2 94%   BMI 25.39 kg/m  Physical Exam Vitals reviewed.  Constitutional:      Appearance: Normal appearance.  HENT:     Head: Normocephalic.     Right Ear: External ear normal.     Left Ear: External ear normal.     Nose: Nose normal.     Mouth/Throat:     Pharynx: Oropharynx is clear.  Eyes:     Pupils: Pupils are equal, round, and reactive to light.  Cardiovascular:     Rate and Rhythm: Normal rate and regular rhythm.     Pulses: Normal pulses.     Comments: No obvious contusion or bruising Tenderness palpation left parasternal border and left lateral chest wall Pulmonary:      Effort: Pulmonary effort is normal.     Comments: Sounds decreased throughout Musculoskeletal:     Cervical back: Normal range of motion.     Comments: Patient will not move the left shoulder due to pain but no point tenderness appreciated  Skin:    General: Skin is warm and dry.     Capillary Refill: Capillary refill takes less than 2 seconds.  Neurological:     General: No focal deficit present.     Mental Status: She is alert.  Psychiatric:        Mood and Affect: Mood normal.     ED Results / Procedures / Treatments   Labs (all labs ordered are listed, but only abnormal results are displayed) Labs Reviewed  CBC - Abnormal; Notable for the following components:      Result Value   WBC 14.2 (*)    Hemoglobin 15.1 (*)    HCT 46.2 (*)    All other components within normal limits  COMPREHENSIVE METABOLIC PANEL WITH GFR - Abnormal; Notable for the following components:   Glucose, Bld 124 (*)    All other components within normal limits  URINALYSIS, ROUTINE W REFLEX MICROSCOPIC    EKG None  Radiology CT Head Wo Contrast Result Date: 07/18/2023 CLINICAL DATA:  Head trauma, minor (Age >= 65y); Neck trauma (Age >= 65y). Weakness and falls. Left shoulder and arm pain. EXAM: CT HEAD WITHOUT CONTRAST CT CERVICAL SPINE WITHOUT CONTRAST TECHNIQUE: Multidetector CT imaging of the head and cervical spine was performed following the standard protocol without intravenous contrast. Multiplanar CT image reconstructions of the cervical spine were also generated. RADIATION DOSE REDUCTION: This exam was performed according to the departmental dose-optimization program which includes automated exposure control, adjustment of the mA and/or kV according to patient size and/or use of iterative reconstruction technique. COMPARISON:  CT head and cervical spine 12/17/2021 FINDINGS: CT HEAD FINDINGS Brain: There is no evidence of an acute cortically based infarct, intracranial hemorrhage, mass, midline  shift, or extra-axial fluid collection. Confluent hypodensities in the cerebral white matter bilaterally and heterogeneous hypodensities in the basal ganglia are nonspecific but compatible with severe chronic small vessel ischemic disease. 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 Mild cerebral atrophy is within normal limits for age. Vascular: Calcified atherosclerosis at the skull base. No hyperdense vessel. Skull: No acute calvarial fracture or suspicious lesion. Sinuses/Orbits: Complete opacification of the left frontal sinus and anterior and mid left ethmoid air cells. Partially visualized hyperdense fluid in the left maxillary sinus. Clear mastoid air cells.  Bilateral cataract extraction. Other: None. CT CERVICAL SPINE FINDINGS Alignment: Unchanged.  No acute traumatic malalignment. Skull base and vertebrae: No acute fracture or suspicious lesion. 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 Soft tissues and spinal canal: No prevertebral fluid or swelling. No visible canal hematoma. Disc levels: Mild disc degeneration and moderate to severe multilevel facet arthrosis. No evidence of high-grade spinal canal stenosis. Advanced right neural foraminal stenosis at C4-5. Upper chest: More fully evaluated on the contemporaneous dedicated chest CT. Other: None. IMPRESSION: 1. No evidence of acute intracranial abnormality or cervical spine fracture. 2. Partially visualized hyperdense fluid/possible hemorrhage in the left maxillary sinus. A maxillofacial CT is recommended if there is any clinical concern for an acute facial fracture. 3. Severe chronic small vessel ischemic disease. Electronically Signed   By: Aundra Lee M.D.   On: 07/18/2023 16:05   CT Cervical Spine Wo Contrast Result Date: 07/18/2023 CLINICAL DATA:  Head trauma, minor (Age >= 65y); Neck trauma (Age >= 65y). Weakness and falls. Left shoulder and arm pain. EXAM: CT HEAD WITHOUT CONTRAST CT CERVICAL SPINE WITHOUT  CONTRAST TECHNIQUE: Multidetector CT imaging of the head and cervical spine was performed following the standard protocol without intravenous contrast. Multiplanar CT image reconstructions of the cervical spine were also generated. RADIATION DOSE REDUCTION: This exam was performed according to the departmental dose-optimization program which includes automated exposure control, adjustment of the mA and/or kV according to patient size and/or use of iterative reconstruction technique. COMPARISON:  CT head and cervical spine 12/17/2021 FINDINGS: CT HEAD FINDINGS Brain: There is no evidence of an acute cortically based infarct, intracranial hemorrhage, mass, midline shift, or extra-axial fluid collection. Confluent hypodensities in the cerebral white matter bilaterally and heterogeneous hypodensities in the basal ganglia are nonspecific but compatible with severe chronic small vessel ischemic disease. 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 Mild cerebral atrophy is within normal limits for age. Vascular: Calcified atherosclerosis at the skull base. No hyperdense vessel. Skull: No acute calvarial fracture or suspicious lesion. Sinuses/Orbits: Complete opacification of the left frontal sinus and anterior and mid left ethmoid air cells. Partially visualized hyperdense fluid in the left maxillary sinus. Clear mastoid air cells. Bilateral cataract extraction. Other: None. CT CERVICAL SPINE FINDINGS Alignment: Unchanged.  No acute traumatic malalignment. Skull base and vertebrae: No acute fracture or suspicious lesion. 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 Soft tissues and spinal canal: No prevertebral fluid or swelling. No visible canal hematoma. Disc levels: Mild disc degeneration and moderate to severe multilevel facet arthrosis. No evidence of high-grade spinal canal stenosis. Advanced right neural foraminal stenosis at C4-5. Upper chest: More fully evaluated on the  contemporaneous dedicated chest CT. Other: None. IMPRESSION: 1. No evidence of acute intracranial abnormality or cervical spine fracture. 2. Partially visualized hyperdense fluid/possible hemorrhage in the left maxillary sinus. A maxillofacial CT is recommended if there is any clinical concern for an acute facial fracture. 3. Severe chronic small vessel ischemic disease. Electronically Signed   By: Aundra Lee M.D.   On: 07/18/2023 16:05   CT Chest W Contrast Result Date: 07/18/2023 CLINICAL DATA:  Chest trauma, blunt. Weakness with multiple falls. Left shoulder, arm, rib and back pain. EXAM: CT CHEST WITH CONTRAST TECHNIQUE: Multidetector CT imaging of the chest was performed during intravenous contrast administration. RADIATION DOSE REDUCTION: This exam was performed according to the departmental dose-optimization program which includes automated exposure control, adjustment of the mA and/or kV according to patient size and/or use of iterative  reconstruction technique. CONTRAST:  75mL OMNIPAQUE  IOHEXOL  300 MG/ML  SOLN COMPARISON:  Chest radiographs 07/18/2023.  Chest CT 12/17/2021. FINDINGS: Cardiovascular: No acute vascular findings. There is mild atherosclerosis of the aorta, great vessels and coronary arteries. No evidence of mediastinal hematoma. The heart size is normal. There is no pericardial effusion. Mediastinum/Nodes: There are no enlarged mediastinal, hilar or axillary lymph nodes. The thyroid  gland, trachea and esophagus demonstrate no significant findings. Lungs/Pleura: Trace left pleural effusion. No pneumothorax. Chronic subpleural reticulation in both lungs has mildly progressed compared with the previous CT. There is mild superimposed atelectasis at both lung bases. No confluent airspace disease or suspicious nodularity. Upper abdomen: No acute findings are seen in the visualized upper abdomen. There is aortic and branch vessel atherosclerosis. Musculoskeletal/Chest wall: Multiple acute  fractures of the left ribs laterally, involving the 3rd through 7th ribs. Most of these are nondisplaced. Minimal displacement of the 5th and 6th rib fractures. There are old right-sided rib fractures. There is a new superior endplate compression fracture at T12. Spine details dictated separately. IMPRESSION: 1. Multiple acute fractures of the left ribs laterally, involving the 3rd through 7th ribs. Most of these are nondisplaced. 2. Trace left pleural effusion. No pneumothorax. 3. New superior endplate compression fracture at T12. Spine details dictated separately. 4. No evidence of acute vascular injury or mediastinal hematoma. 5. Chronic subpleural reticulation in both lungs has mildly progressed compared with the previous CT. 6.  Aortic Atherosclerosis (ICD10-I70.0). Electronically Signed   By: Elmon Hagedorn M.D.   On: 07/18/2023 15:56   DG Chest Port 1 View Result Date: 07/18/2023 CLINICAL DATA:  fall, chest pain EXAM: PORTABLE CHEST - 1 VIEW COMPARISON:  November 05, 2015 FINDINGS: Low lung volumes. No focal airspace consolidation, pleural effusion, or pneumothorax. Biapical pleural thickening. No cardiomegaly. Tortuous aorta with aortic atherosclerosis. Remote, right-sided rib fractures. The possible rib fractures on the left are better visualized on the shoulder radiograph. Multilevel thoracic osteophytosis. IMPRESSION: No pneumonia, pulmonary edema, or pneumothorax. Electronically Signed   By: Rance Burrows M.D.   On: 07/18/2023 14:29   DG Shoulder Left Result Date: 07/18/2023 CLINICAL DATA:  fall, chest pain, left shoulder pain EXAM: LEFT SHOULDER - 2+ VIEW COMPARISON:  None Available. FINDINGS: Osteopenia. No acute fracture or dislocation of the shoulder. A couple of likely mildly displaced left lateral rib fractures are present. Soft tissues are unremarkable. IMPRESSION: A couple of mildly displaced left lateral rib fractures are likely present. Otherwise, no acute fracture or dislocation of the  shoulder. Clinical correlation requested. Electronically Signed   By: Rance Burrows M.D.   On: 07/18/2023 14:27    Procedures Procedures    Medications Ordered in ED Medications  iohexol  (OMNIPAQUE ) 300 MG/ML solution 75 mL (75 mLs Intravenous Contrast Given 07/18/23 1456)    ED Course/ Medical Decision Making/ A&P Clinical Course as of 07/18/23 1608  Tue Jul 18, 2023  1427 Chest x-Yvonnia Tango reviewed interpreted no evidence of acute abnormality noted on my interpretation awaiting radiologist interpretation [DR]  1428 Left shoulder x-Ciela Mahajan without obvious shoulder abnormality but does reveal multiple rib fractures [DR]    Clinical Course User Index [DR] Auston Blush, MD                                 Medical Decision Making Amount and/or Complexity of Data Reviewed Labs: ordered. Radiology: ordered.  Risk Prescription drug management.    Patient with multiple falls  unclear etiology but denies loc and feels that she is just generally weak causing falls.Labs reveal mildly elevate wbc, no acute metabolic derangement noted on cmet  Patient had ct to evaluate chest pain that occurred with fall, head and spine cts ordered, results pending. Care discussed with Dr. Moses Arenas who will dispo after results.        Final Clinical Impression(s) / ED Diagnoses Final diagnoses:  Fall, initial encounter  Chest pain, unspecified type  Closed fracture of multiple ribs of left side, initial encounter    Rx / DC Orders ED Discharge Orders     None         Auston Blush, MD 07/18/23 [id number]

## 2023-07-18 NOTE — ED Notes (Signed)
 Called to give report and per [facility] nurse is busy at this time. Provided number [phone] for her to call back for report when ready.

## 2023-07-18 NOTE — ED Notes (Signed)
 Patient denies pain and is resting comfortably.

## 2023-07-19 LAB — BASIC METABOLIC PANEL WITH GFR
Anion gap: 8 (ref 5–15)
BUN: 26 mg/dL — ABNORMAL HIGH (ref 8–23)
CO2: 24 mmol/L (ref 22–32)
Calcium: 8.3 mg/dL — ABNORMAL LOW (ref 8.9–10.3)
Chloride: 106 mmol/L (ref 98–111)
Creatinine, Ser: 1.2 mg/dL — ABNORMAL HIGH (ref 0.44–1.00)
GFR, Estimated: 47 mL/min — ABNORMAL LOW (ref >60)
Glucose, Bld: 134 mg/dL — ABNORMAL HIGH (ref 70–99)
Potassium: 3.8 mmol/L (ref 3.5–5.1)
Sodium: 138 mmol/L (ref 135–145)

## 2023-07-19 LAB — URINALYSIS, W/ REFLEX TO CULTURE (INFECTION SUSPECTED)
Glucose, UA: NEGATIVE mg/dL
Hgb urine dipstick: NEGATIVE
Ketones, ur: NEGATIVE mg/dL
Leukocytes,Ua: NEGATIVE
Nitrite: NEGATIVE
Protein, ur: NEGATIVE mg/dL
Specific Gravity, Urine: 1.025 (ref 1.005–1.030)
pH: 5.5 (ref 5.0–8.0)

## 2023-07-19 LAB — CBC
HCT: 41.4 % (ref 36.0–46.0)
Hemoglobin: 13.8 g/dL (ref 12.0–15.0)
MCH: 32.5 pg (ref 26.0–34.0)
MCHC: 33.3 g/dL (ref 30.0–36.0)
MCV: 97.4 fL (ref 80.0–100.0)
Platelets: 210 10*3/uL (ref 150–400)
RBC: 4.25 MIL/uL (ref 3.87–5.11)
RDW: 13.1 % (ref 11.5–15.5)
WBC: 12.1 10*3/uL — ABNORMAL HIGH (ref 4.0–10.5)
nRBC: 0 % (ref 0.0–0.2)

## 2023-07-19 MED ORDER — PROPRANOLOL HCL ER 80 MG PO CP24
80.0000 mg | ORAL_CAPSULE | Freq: Every day | ORAL | Status: DC
  Administered 2023-07-19 – 2023-07-25 (×7): 80 mg via ORAL
  Filled 2023-07-19 (×7): qty 1

## 2023-07-19 MED ORDER — POLYETHYLENE GLYCOL 3350 17 G PO PACK
17.0000 g | PACK | Freq: Every day | ORAL | Status: DC
  Administered 2023-07-19 – 2023-07-23 (×3): 17 g via ORAL
  Filled 2023-07-19 (×6): qty 1

## 2023-07-19 MED ORDER — RAMIPRIL 1.25 MG PO CAPS
2.5000 mg | ORAL_CAPSULE | Freq: Every day | ORAL | Status: DC
  Administered 2023-07-19 – 2023-07-25 (×7): 2.5 mg via ORAL
  Filled 2023-07-19 (×8): qty 2

## 2023-07-19 MED ORDER — ROSUVASTATIN CALCIUM 20 MG PO TABS: 20.0000 mg | ORAL_TABLET | Freq: Every day | ORAL | Status: DC

## 2023-07-19 MED ORDER — BUPROPION HCL ER (XL) 150 MG PO TB24
450.0000 mg | ORAL_TABLET | Freq: Every day | ORAL | Status: DC
  Administered 2023-07-19 – 2023-07-25 (×7): 450 mg via ORAL
  Filled 2023-07-19 (×7): qty 3

## 2023-07-19 NOTE — Plan of Care (Signed)
  Problem: Activity: Goal: Risk for activity intolerance will decrease Outcome: Not Progressing   Problem: Coping: Goal: Level of anxiety will decrease Outcome: Not Progressing   Problem: Pain Managment: Goal: General experience of comfort will improve and/or be controlled Outcome: Not Progressing   Problem: Safety: Goal: Ability to remain free from injury will improve Outcome: Not Progressing   Problem: Skin Integrity: Goal: Risk for impaired skin integrity will decrease Outcome: Not Progressing

## 2023-07-19 NOTE — Evaluation (Signed)
 Physical Therapy Evaluation Patient Details Name: Selena Morris MRN: [id number] DOB: 1945/03/20 Today's Date: 07/19/2023  History of Present Illness  Patient is 78 y.o. female presented after a fall  resulting in T12 compression fx and Lt 3-7 rib fx. Lt shoulder x-ray negative for acute injury. PMH significant for frequent falls recently, ADD, anxiety, depression, GERD, HLD, HTN, osteoporosis.   Clinical Impression  Selena Morris is 78 y.o. female admitted with above HPI and diagnosis. Patient is currently limited by functional impairments below (see PT problem list). Patient lives alone and is mod ind with occasional use of AD for mobility in home at baseline. Currently pt is most limited with bed mobility by pain in Lt side/chest and requires mod assist for log roll technique. Pt reliant on single UE support for seated balance and holding pressure to Lt side of chest for pain control. Total assist for TLSO donning and pt reported pain relief once applied and tightened. Pt able to power up and transfer bed>chair with RW and min assist. Patient will benefit from continued skilled PT interventions to address impairments and progress independence with mobility. Patient will benefit from continued inpatient follow up therapy, <3 hours/day. Acute PT will follow and progress as able.         If plan is discharge home, recommend the following: A little help with bathing/dressing/bathroom;A little help with walking and/or transfers;Assistance with cooking/housework;Help with stairs or ramp for entrance;Assist for transportation   Can travel by private vehicle   Yes    Equipment Recommendations BSC/3in1 (TBD, pt has RW at home)  Recommendations for Other Services       Functional Status Assessment Patient has had a recent decline in their functional status and demonstrates the ability to make significant improvements in function in a reasonable and predictable amount of time.      Precautions / Restrictions Precautions Precautions: Fall Required Braces or Orthoses: Spinal Brace Spinal Brace: Thoracolumbosacral orthotic;Applied in sitting position Restrictions Weight Bearing Restrictions Per Provider Order: No      Mobility  Bed Mobility Overal bed mobility: Needs Assistance Bed Mobility: Sidelying to Sit, Rolling Rolling: Mod assist, Used rails Sidelying to sit: Mod assist, Used rails, HOB elevated            Transfers Overall transfer level: Needs assistance Equipment used: Rolling walker (2 wheels) Transfers: Sit to/from Stand Sit to Stand: Min assist           General transfer comment: light assist with cues for hand placement for power up. cues to lift head once standing.    Ambulation/Gait Ambulation/Gait assistance: Min assist Gait Distance (Feet): 5 Feet Assistive device: Rolling walker (2 wheels) Gait Pattern/deviations: Step-through pattern, Decreased step length - right, Decreased stride length, Shuffle, Trunk flexed Gait velocity: decr        Stairs            Wheelchair Mobility     Tilt Bed    Modified Rankin (Stroke Patients Only)       Balance Overall balance assessment: Needs assistance Sitting-balance support: Feet supported, Bilateral upper extremity supported Sitting balance-Leahy Scale: Poor Sitting balance - Comments: reliant on UE support   Standing balance support: During functional activity, Bilateral upper extremity supported, Reliant on assistive device for balance Standing balance-Leahy Scale: Poor                               Pertinent Vitals/Pain Pain  Assessment Pain Assessment: Faces Faces Pain Scale: Hurts whole lot Pain Location: Lt chest/ribs Pain Descriptors / Indicators: Aching, Discomfort, Guarding, Grimacing Pain Intervention(s): Limited activity within patient's tolerance, Monitored during session, Repositioned, Other (comment) (TLSO helping with pain)    Home  Living Family/patient expects to be discharged to:: Private residence Living Arrangements: Alone Available Help at Discharge: Family Type of Home: House Home Access: Stairs to enter   Secretary/administrator of Steps: 2   Home Layout: One level Home Equipment: Agricultural consultant (2 wheels);Cane - single point      Prior Function Prior Level of Function : Independent/Modified Independent             Mobility Comments: mod ind with intermittent use of AD, pt reports frequent falls recently       Extremity/Trunk Assessment   Upper Extremity Assessment Upper Extremity Assessment: Defer to OT evaluation;Generalized weakness;LUE deficits/detail LUE Deficits / Details: continues to have shoulder pain but may be more chest pain with attempts to use Lt UE    Lower Extremity Assessment Lower Extremity Assessment: Generalized weakness (grossly 3/5)    Cervical / Trunk Assessment Cervical / Trunk Assessment: Kyphotic;Other exceptions Cervical / Trunk Exceptions: TLSO  Communication   Communication Communication: No apparent difficulties    Cognition Arousal: Alert Behavior During Therapy: WFL for tasks assessed/performed   PT - Cognitive impairments: No apparent impairments                         Following commands: Intact       Cueing Cueing Techniques: Verbal cues     General Comments      Exercises     Assessment/Plan    PT Assessment Patient needs continued PT services  PT Problem List Decreased strength;Decreased range of motion;Decreased activity tolerance;Decreased balance;Decreased mobility;Decreased knowledge of use of DME;Decreased safety awareness;Decreased knowledge of precautions;Pain       PT Treatment Interventions DME instruction;Gait training;Stair training;Functional mobility training;Therapeutic activities;Therapeutic exercise;Balance training;Neuromuscular re-education;Patient/family education;Wheelchair mobility training    PT Goals  (Current goals can be found in the Care Plan section)  Acute Rehab PT Goals Patient Stated Goal: stop hurting and stop falling PT Goal Formulation: With patient Time For Goal Achievement: 08/02/23 Potential to Achieve Goals: Good    Frequency Min 2X/week     Co-evaluation               AM-PAC PT "6 Clicks" Mobility  Outcome Measure Help needed turning from your back to your side while in a flat bed without using bedrails?: A Lot Help needed moving from lying on your back to sitting on the side of a flat bed without using bedrails?: A Lot Help needed moving to and from a bed to a chair (including a wheelchair)?: A Little Help needed standing up from a chair using your arms (e.g., wheelchair or bedside chair)?: A Little Help needed to walk in hospital room?: A Little Help needed climbing 3-5 steps with a railing? : A Lot 6 Click Score: 15    End of Session Equipment Utilized During Treatment: Gait belt;Back brace Activity Tolerance: Patient tolerated treatment well Patient left: in chair;with call bell/phone within reach;with chair alarm set;with family/visitor present Nurse Communication: Mobility status PT Visit Diagnosis: Other abnormalities of gait and mobility (R26.89);Muscle weakness (generalized) (M62.81);Difficulty in walking, not elsewhere classified (R26.2);Pain;History of falling (Z91.81);Unsteadiness on feet (R26.81) Pain - Right/Left: Left Pain - part of body:  (chest, side, back)    Time:  1610-9604 PT Time Calculation (min) (ACUTE ONLY): 30 min   Charges:   PT Evaluation $PT Eval Moderate Complexity: 1 Mod PT Treatments $Therapeutic Activity: 8-22 mins PT General Charges $$ ACUTE PT VISIT: 1 Visit         Tish Forge, DPT Acute Rehabilitation Services Office [phone]  07/19/23 2:39 PM

## 2023-07-19 NOTE — Progress Notes (Signed)
 Patient has not urinated since transfer from [place] [facility], last known urination was at 1930 when urinalysis specimen was sent. Bladder scan performed and noted 296 mls, no bladder distention noted. Patient also states that she does not feel the urge yet to void at the moment. Paged Dr. Marny Sires, no new order for now and will monitor for now.

## 2023-07-19 NOTE — Evaluation (Signed)
 Occupational Therapy Evaluation Patient Details Name: Selena Morris MRN: [id number] DOB: 1945-11-06 Today's Date: 07/19/2023   History of Present Illness   Patient is 78 y.o. female presented after a fall  resulting in T12 compression fx and Lt 3-7 rib fx. Lt shoulder x-ray negative for acute injury. PMH significant for frequent falls recently, ADD, anxiety, depression, GERD, HLD, HTN, osteoporosis.     Clinical Impressions Pt admitted based on above, and was seen based on problem list below. PTA pt was independent with ADLs and IADLs. Today pt is requiring set up  to total assist for ADLs. Bed mobility was up to mod assist. Functional transfers are  CGA. Pt often limited by pain and decreased standing balance. Shows great potential for improvement once pain is manageable. Recommendation of <3 hours of skilled rehab daily. OT will continue to follow acutely to maximize functional independence.        If plan is discharge home, recommend the following:   A little help with walking and/or transfers;A lot of help with bathing/dressing/bathroom     Functional Status Assessment   Patient has had a recent decline in their functional status and demonstrates the ability to make significant improvements in function in a reasonable and predictable amount of time.     Equipment Recommendations   Other (comment) (Defer to next venue)      Precautions/Restrictions   Precautions Precautions: Fall Recall of Precautions/Restrictions: Intact Required Braces or Orthoses: Spinal Brace Spinal Brace: Thoracolumbosacral orthotic;Applied in sitting position Restrictions Weight Bearing Restrictions Per Provider Order: No     Mobility Bed Mobility Overal bed mobility: Needs Assistance Bed Mobility: Sidelying to Sit, Rolling, Sit to Sidelying Rolling: Used rails, Min assist Sidelying to sit: Mod assist, Used rails, HOB elevated     Sit to sidelying: Min assist, HOB elevated, Used  rails General bed mobility comments: Cues for sequencing, pt bracing for pain, requiring max encouragement to complete    Transfers Overall transfer level: Needs assistance Equipment used: Rolling walker (2 wheels) Transfers: Sit to/from Stand, Bed to chair/wheelchair/BSC Sit to Stand: Contact guard assist Stand pivot transfers: Contact guard assist         General transfer comment: With increased time pt able to complete no physical assist      Balance Overall balance assessment: Needs assistance Sitting-balance support: Feet supported, Bilateral upper extremity supported Sitting balance-Leahy Scale: Poor Sitting balance - Comments: reliant on UE support   Standing balance support: During functional activity, Bilateral upper extremity supported, Reliant on assistive device for balance Standing balance-Leahy Scale: Poor       ADL either performed or assessed with clinical judgement   ADL Overall ADL's : Needs assistance/impaired Eating/Feeding: Set up;Sitting   Grooming: Set up;Sitting   Upper Body Bathing: Set up;Sitting   Lower Body Bathing: Moderate assistance;Sit to/from stand Lower Body Bathing Details (indicate cue type and reason): Pt unable to reach Bil lower legs Upper Body Dressing : Set up;Sitting   Lower Body Dressing: Moderate assistance;Sit to/from stand Lower Body Dressing Details (indicate cue type and reason): Unable to reach bil lower legs, unable to maintain standing balance with single UE Toilet Transfer: Contact guard assist;Stand-pivot;BSC/3in1;Rolling walker (2 wheels) Toilet Transfer Details (indicate cue type and reason): With increased time Toileting- Architect and Hygiene: Sit to/from stand;Total assistance Toileting - Clothing Manipulation Details (indicate cue type and reason): Pt unable to reach bottom to wipe, unable to maintain standing balance with single UE support     Functional mobility  during ADLs: Contact guard  assist;Rolling walker (2 wheels) General ADL Comments: Pain appears to be limiting pt     Vision Baseline Vision/History: 1 Wears glasses Vision Assessment?: No apparent visual deficits            Pertinent Vitals/Pain Pain Assessment Pain Assessment: Faces Faces Pain Scale: Hurts whole lot Pain Location: Lt chest/ribs Pain Descriptors / Indicators: Aching, Discomfort, Guarding, Grimacing Pain Intervention(s): Premedicated before session, Repositioned     Extremity/Trunk Assessment Upper Extremity Assessment Upper Extremity Assessment: LUE deficits/detail LUE Deficits / Details: pain with mobility, most likely in ribs, limited mobility at shoulder and elbow d/t pain LUE: Shoulder pain with ROM   Lower Extremity Assessment Lower Extremity Assessment: Defer to PT evaluation   Cervical / Trunk Assessment Cervical / Trunk Assessment: Kyphotic;Other exceptions Cervical / Trunk Exceptions: TLSO   Communication Communication Communication: No apparent difficulties   Cognition Arousal: Alert Behavior During Therapy: Anxious Cognition: No apparent impairments     Following commands: Intact       Cueing  General Comments   Cueing Techniques: Verbal cues  Pt returned to [facility], RN notified           Home Living Family/patient expects to be discharged to:: Private residence Living Arrangements: Alone Available Help at Discharge: Family Type of Home: House Home Access: Stairs to enter Secretary/administrator of Steps: 2   Home Layout: One level   Bathroom Accessibility: Yes   Home Equipment: Agricultural consultant (2 wheels);Cane - single point          Prior Functioning/Environment Prior Level of Function : Independent/Modified Independent       Mobility Comments: mod ind with intermittent use of AD, pt reports frequent falls recently      OT Problem List: Decreased strength;Decreased range of motion;Decreased activity tolerance;Impaired balance (sitting and/or  standing);Decreased safety awareness   OT Treatment/Interventions: Self-care/ADL training;Therapeutic exercise;DME and/or AE instruction;Energy conservation;Therapeutic activities;Patient/family education;Balance training      OT Goals(Current goals can be found in the care plan section)   Acute Rehab OT Goals Patient Stated Goal: To feel better OT Goal Formulation: With patient Time For Goal Achievement: 08/02/23 Potential to Achieve Goals: Good   OT Frequency:  Min 2X/week       AM-PAC OT "6 Clicks" Daily Activity     Outcome Measure Help from another person eating meals?: None Help from another person taking care of personal grooming?: A Little Help from another person toileting, which includes using toliet, bedpan, or urinal?: Total Help from another person bathing (including washing, rinsing, drying)?: A Lot Help from another person to put on and taking off regular upper body clothing?: A Little Help from another person to put on and taking off regular lower body clothing?: A Lot 6 Click Score: 15   End of Session Equipment Utilized During Treatment: Gait belt;Back brace;Rolling walker (2 wheels) Nurse Communication: Mobility status;Patient requests pain meds  Activity Tolerance: Patient limited by pain Patient left: with call bell/phone within reach;Other (comment) (On BSC, RN notified)  OT Visit Diagnosis: Unsteadiness on feet (R26.81);Other abnormalities of gait and mobility (R26.89);Repeated falls (R29.6)                Time: 4098-1191 OT Time Calculation (min): 35 min Charges:  OT General Charges $OT Visit: 1 Visit OT Evaluation $OT Eval Moderate Complexity: 1 Mod OT Treatments $Self Care/Home Management : 8-22 mins  Delmer Ferraris, OT  Acute Rehabilitation Services Office [phone] Secure chat preferred   Tillman Folks  Hosteen Kienast 07/19/2023, 5:03 PM

## 2023-07-19 NOTE — Progress Notes (Signed)
 Subjective: Hasn't voided since last night.  Is starting to feel like she needs to void.  On 2L of [place].  Complains mostly of chest pain.  Doesn't feel back much right now lying in bed.  Patient seems a bit confused this morning and is struggling to remember details.  She apparently just told the pharmacy tech that she took plavix yesterday, but when I walked in to ask about this, she could not remember if she even takes it or not and just states that her pills have a label that say "AM or PM" to help her know what to take.  She lives by herself.  She states she fell last Thursday, Friday, Sunday, and Monday.  Denies hitting her head.  ROS: See above, otherwise other systems negative  Objective: Vital signs in last 24 hours: Temp:  [98.1 F (36.7 C)-99.3 F (37.4 C)] 98.9 F (37.2 C) (05/14 0414) Pulse Rate:  [68-80] 73 (05/14 0600) Resp:  [16-30] 16 (05/14 0414) BP: (88-163)/(66-95) 112/75 (05/14 0600) SpO2:  [92 %-98 %] 96 % (05/14 0414) Weight:  [59 kg] 59 kg (05/13 1205) Last BM Date : 07/17/23  Intake/Output from previous day: 05/13 0701 - 05/14 0700 In: 237 [P.O.:237] Out: 0  Intake/Output this shift: No intake/output data recorded.  PE: Gen: NAD, initially sleeping HEENT: PERRL Heart: regular Lungs: CTAB, chest wall tenderness on left side as expected Abd: soft, NT, ND GU: purewick in place with no UOP Ext: MAEs with no deficits, normal ROM except LUE with some decreased ROM secondary to pain from ribs Psych: A&Ox3  Lab Results:  Recent Labs    07/18/23 1310 07/19/23 0615  WBC 14.2* 12.1*  HGB 15.1* 13.8  HCT 46.2* 41.4  PLT 236 210   BMET Recent Labs    07/18/23 1310 07/19/23 0615  NA 138 138  K 3.5 3.8  CL 101 106  CO2 25 24  GLUCOSE 124* 134*  BUN 20 26*  CREATININE 0.51 1.20*  CALCIUM 9.3 8.3*   PT/INR No results for input(s): "LABPROT", "INR" in the last 72 hours. CMP     Component Value Date/Time   NA 138 07/19/2023 0615   K 3.8  07/19/2023 0615   CL 106 07/19/2023 0615   CO2 24 07/19/2023 0615   GLUCOSE 134 (H) 07/19/2023 0615   BUN 26 (H) 07/19/2023 0615   CREATININE 1.20 (H) 07/19/2023 0615   CALCIUM 8.3 (L) 07/19/2023 0615   PROT 7.7 07/18/2023 1310   ALBUMIN 4.4 07/18/2023 1310   AST 19 07/18/2023 1310   ALT 20 07/18/2023 1310   ALKPHOS 91 07/18/2023 1310   BILITOT 0.9 07/18/2023 1310   GFRNONAA 47 (L) 07/19/2023 0615   GFRAA >60 11/05/2015 1305   Lipase  No results found for: "LIPASE"     Studies/Results: CT Lumbar Spine Wo Contrast Result Date: 07/18/2023 CLINICAL DATA:  Back trauma, no prior imaging (Age >= 16y). Weakness and falls. Back pain. EXAM: CT LUMBAR SPINE WITHOUT CONTRAST TECHNIQUE: Multidetector CT imaging of the lumbar spine was performed without intravenous contrast administration. Multiplanar CT image reconstructions were also generated. RADIATION DOSE REDUCTION: This exam was performed according to the departmental dose-optimization program which includes automated exposure control, adjustment of the mA and/or kV according to patient size and/or use of iterative reconstruction technique. COMPARISON:  Lumbar spine CT 12/17/2021 FINDINGS: Segmentation: There are 5 non-rib-bearing lumbar type vertebrae and 13 rib-bearing thoracic vertebrae. Alignment: Unchanged trace anterolisthesis of L4 on L5.  Slightly exaggerated lumbar lordosis. Vertebrae: No acute fracture or suspicious lesion. Paraspinal and other soft tissues: Abdominal aortic atherosclerosis without aneurysm. Disc levels: Mild spondylosis with preserved disc heights throughout the lumbar spine. Diffuse facet arthrosis which is severe at L4-5 and L5-S1. No evidence of high-grade spinal canal or neural foraminal stenosis. IMPRESSION: 1. No acute lumbar spine fracture. 2. Severe facet arthrosis at L4-5 and L5-S1. 3.  Aortic Atherosclerosis (ICD10-I70.0). Electronically Signed   By: Aundra Lee M.D.   On: 07/18/2023 16:20   CT T-SPINE NO  CHARGE Result Date: 07/18/2023 CLINICAL DATA:  Trauma.  Weakness and falls.  Back pain. EXAM: CT THORACIC SPINE WITH CONTRAST TECHNIQUE: Multiplanar CT images of the thoracic spine were reconstructed from contemporary CT of the Chest. RADIATION DOSE REDUCTION: This exam was performed according to the departmental dose-optimization program which includes automated exposure control, adjustment of the mA and/or kV according to patient size and/or use of iterative reconstruction technique. CONTRAST:  No additional. COMPARISON:  CT chest 12/17/2021 FINDINGS: Alignment: Mildly exaggerated thoracic kyphosis. No significant listhesis. Vertebrae: T12 superior endplate compression fracture with 55% vertebral body height loss anteriorly, new from 2023. Associated sclerosis without a visible acute appearing fracture line or paraspinal hematoma. 3-4 mm retropulsion of the posterosuperior T12 vertebral body. No posterior element fracture. No suspicious bone lesion. Left-sided rib fractures as described on today's separate chest CT. Paraspinal and other soft tissues: No acute abnormality in the paraspinal soft tissues. Remainder of the chest reported separately. Disc levels: Predominantly mild multilevel disc degeneration and up to moderate facet arthrosis. Mild spinal stenosis at T11-12 due to T12 retropulsion. IMPRESSION: T12 compression fracture with 55% height loss, new from 2023 but otherwise age indeterminate and favored to be subacute or chronic. Correlate with point tenderness and consider obtaining a spine MRI if there is clinical concern for a recent fracture. Slight retropulsion with mild spinal stenosis. Electronically Signed   By: Aundra Lee M.D.   On: 07/18/2023 16:16   CT Head Wo Contrast Result Date: 07/18/2023 CLINICAL DATA:  Head trauma, minor (Age >= 65y); Neck trauma (Age >= 65y). Weakness and falls. Left shoulder and arm pain. EXAM: CT HEAD WITHOUT CONTRAST CT CERVICAL SPINE WITHOUT CONTRAST TECHNIQUE:  Multidetector CT imaging of the head and cervical spine was performed following the standard protocol without intravenous contrast. Multiplanar CT image reconstructions of the cervical spine were also generated. RADIATION DOSE REDUCTION: This exam was performed according to the departmental dose-optimization program which includes automated exposure control, adjustment of the mA and/or kV according to patient size and/or use of iterative reconstruction technique. COMPARISON:  CT head and cervical spine 12/17/2021 FINDINGS: CT HEAD FINDINGS Brain: There is no evidence of an acute cortically based infarct, intracranial hemorrhage, mass, midline shift, or extra-axial fluid collection. Confluent hypodensities in the cerebral white matter bilaterally and heterogeneous hypodensities in the basal ganglia are nonspecific but compatible with severe chronic small vessel ischemic disease. 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 Mild cerebral atrophy is within normal limits for age. Vascular: Calcified atherosclerosis at the skull base. No hyperdense vessel. Skull: No acute calvarial fracture or suspicious lesion. Sinuses/Orbits: Complete opacification of the left frontal sinus and anterior and mid left ethmoid air cells. Partially visualized hyperdense fluid in the left maxillary sinus. Clear mastoid air cells. Bilateral cataract extraction. Other: None. CT CERVICAL SPINE FINDINGS Alignment: Unchanged.  No acute traumatic malalignment. Skull base and vertebrae: No acute fracture or suspicious lesion. 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 Soft tissues and spinal canal: No prevertebral fluid or swelling. No visible canal hematoma. Disc levels: Mild disc degeneration and moderate to severe multilevel facet arthrosis. No evidence of high-grade spinal canal stenosis. Advanced right neural foraminal stenosis at C4-5. Upper chest: More fully evaluated on the contemporaneous dedicated chest  CT. Other: None. IMPRESSION: 1. No evidence of acute intracranial abnormality or cervical spine fracture. 2. Partially visualized hyperdense fluid/possible hemorrhage in the left maxillary sinus. A maxillofacial CT is recommended if there is any clinical concern for an acute facial fracture. 3. Severe chronic small vessel ischemic disease. Electronically Signed   By: Aundra Lee M.D.   On: 07/18/2023 16:05   CT Cervical Spine Wo Contrast Result Date: 07/18/2023 CLINICAL DATA:  Head trauma, minor (Age >= 65y); Neck trauma (Age >= 65y). Weakness and falls. Left shoulder and arm pain. EXAM: CT HEAD WITHOUT CONTRAST CT CERVICAL SPINE WITHOUT CONTRAST TECHNIQUE: Multidetector CT imaging of the head and cervical spine was performed following the standard protocol without intravenous contrast. Multiplanar CT image reconstructions of the cervical spine were also generated. RADIATION DOSE REDUCTION: This exam was performed according to the departmental dose-optimization program which includes automated exposure control, adjustment of the mA and/or kV according to patient size and/or use of iterative reconstruction technique. COMPARISON:  CT head and cervical spine 12/17/2021 FINDINGS: CT HEAD FINDINGS Brain: There is no evidence of an acute cortically based infarct, intracranial hemorrhage, mass, midline shift, or extra-axial fluid collection. Confluent hypodensities in the cerebral white matter bilaterally and heterogeneous hypodensities in the basal ganglia are nonspecific but compatible with severe chronic small vessel ischemic disease. 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 Mild cerebral atrophy is within normal limits for age. Vascular: Calcified atherosclerosis at the skull base. No hyperdense vessel. Skull: No acute calvarial fracture or suspicious lesion. Sinuses/Orbits: Complete opacification of the left frontal sinus and anterior and mid left ethmoid air cells. Partially  visualized hyperdense fluid in the left maxillary sinus. Clear mastoid air cells. Bilateral cataract extraction. Other: None. CT CERVICAL SPINE FINDINGS Alignment: Unchanged.  No acute traumatic malalignment. Skull base and vertebrae: No acute fracture or suspicious lesion. 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 Soft tissues and spinal canal: No prevertebral fluid or swelling. No visible canal hematoma. Disc levels: Mild disc degeneration and moderate to severe multilevel facet arthrosis. No evidence of high-grade spinal canal stenosis. Advanced right neural foraminal stenosis at C4-5. Upper chest: More fully evaluated on the contemporaneous dedicated chest CT. Other: None. IMPRESSION: 1. No evidence of acute intracranial abnormality or cervical spine fracture. 2. Partially visualized hyperdense fluid/possible hemorrhage in the left maxillary sinus. A maxillofacial CT is recommended if there is any clinical concern for an acute facial fracture. 3. Severe chronic small vessel ischemic disease. Electronically Signed   By: Aundra Lee M.D.   On: 07/18/2023 16:05   CT Chest W Contrast Result Date: 07/18/2023 CLINICAL DATA:  Chest trauma, blunt. Weakness with multiple falls. Left shoulder, arm, rib and back pain. EXAM: CT CHEST WITH CONTRAST TECHNIQUE: Multidetector CT imaging of the chest was performed during intravenous contrast administration. RADIATION DOSE REDUCTION: This exam was performed according to the departmental dose-optimization program which includes automated exposure control, adjustment of the mA and/or kV according to patient size and/or use of iterative reconstruction technique. CONTRAST:  75mL OMNIPAQUE  IOHEXOL  300 MG/ML  SOLN COMPARISON:  Chest radiographs 07/18/2023.  Chest CT 12/17/2021. FINDINGS: Cardiovascular: No acute vascular findings. There  is mild atherosclerosis of the aorta, great vessels and coronary arteries. No evidence of mediastinal hematoma. The heart size is normal. There  is no pericardial effusion. Mediastinum/Nodes: There are no enlarged mediastinal, hilar or axillary lymph nodes. The thyroid  gland, trachea and esophagus demonstrate no significant findings. Lungs/Pleura: Trace left pleural effusion. No pneumothorax. Chronic subpleural reticulation in both lungs has mildly progressed compared with the previous CT. There is mild superimposed atelectasis at both lung bases. No confluent airspace disease or suspicious nodularity. Upper abdomen: No acute findings are seen in the visualized upper abdomen. There is aortic and branch vessel atherosclerosis. Musculoskeletal/Chest wall: Multiple acute fractures of the left ribs laterally, involving the 3rd through 7th ribs. Most of these are nondisplaced. Minimal displacement of the 5th and 6th rib fractures. There are old right-sided rib fractures. There is a new superior endplate compression fracture at T12. Spine details dictated separately. IMPRESSION: 1. Multiple acute fractures of the left ribs laterally, involving the 3rd through 7th ribs. Most of these are nondisplaced. 2. Trace left pleural effusion. No pneumothorax. 3. New superior endplate compression fracture at T12. Spine details dictated separately. 4. No evidence of acute vascular injury or mediastinal hematoma. 5. Chronic subpleural reticulation in both lungs has mildly progressed compared with the previous CT. 6.  Aortic Atherosclerosis (ICD10-I70.0). Electronically Signed   By: Elmon Hagedorn M.D.   On: 07/18/2023 15:56   DG Chest Port 1 View Result Date: 07/18/2023 CLINICAL DATA:  fall, chest pain EXAM: PORTABLE CHEST - 1 VIEW COMPARISON:  November 05, 2015 FINDINGS: Low lung volumes. No focal airspace consolidation, pleural effusion, or pneumothorax. Biapical pleural thickening. No cardiomegaly. Tortuous aorta with aortic atherosclerosis. Remote, right-sided rib fractures. The possible rib fractures on the left are better visualized on the shoulder radiograph.  Multilevel thoracic osteophytosis. IMPRESSION: No pneumonia, pulmonary edema, or pneumothorax. Electronically Signed   By: Rance Burrows M.D.   On: 07/18/2023 14:29   DG Shoulder Left Result Date: 07/18/2023 CLINICAL DATA:  fall, chest pain, left shoulder pain EXAM: LEFT SHOULDER - 2+ VIEW COMPARISON:  None Available. FINDINGS: Osteopenia. No acute fracture or dislocation of the shoulder. A couple of likely mildly displaced left lateral rib fractures are present. Soft tissues are unremarkable. IMPRESSION: A couple of mildly displaced left lateral rib fractures are likely present. Otherwise, no acute fracture or dislocation of the shoulder. Clinical correlation requested. Electronically Signed   By: Rance Burrows M.D.   On: 07/18/2023 14:27    Anti-infectives: Anti-infectives (From admission, onward)    None        Assessment/Plan GLF, s/p multiple recent falls L rib fx 3-7 - pulm toilet, pain control, IS, mobilize with therapies T12 compression fx - NSGY, Nundkumar to see. Rec TLSO brace for now which is in the room AKI/decrease UOP - Cr has doubled from 0.5 to 1.2 from yesterday to today and hasn't voided since last night.  Increase IVFs to better hydrate, but also monitor the need for foley for urinary retention and AKI secondary to this.  UA reviewed.  Will send for culture.  BMET in am GERD Depression - home meds Anxiety  HTN - restart home meds HLD - restart home meds FEN - regular, IVFs at 125cc for 24 hrs VTE - lovenox, apparently takes Plavix, will hold for now, especially given frequent falls.  Unclear why she is on this. ID - none currently, monitor UA Dispo - therapies, monitor urine cx and Creatinine   I reviewed nursing notes, ED provider  notes, last 24 h vitals and pain scores, last 48 h intake and output, last 24 h labs and trends, and last 24 h imaging results.   LOS: 1 day    Leone Ralphs , [facility] Surgery 07/19/2023, 8:24 AM Please see Amion  for pager number during day hours 7:00am-4:30pm or 7:00am -11:30am on weekends

## 2023-07-20 LAB — BASIC METABOLIC PANEL WITH GFR
Anion gap: 7 (ref 5–15)
BUN: 21 mg/dL (ref 8–23)
CO2: 24 mmol/L (ref 22–32)
Calcium: 8.4 mg/dL — ABNORMAL LOW (ref 8.9–10.3)
Chloride: 109 mmol/L (ref 98–111)
Creatinine, Ser: 0.89 mg/dL (ref 0.44–1.00)
GFR, Estimated: >60 mL/min (ref >60)
Glucose, Bld: 99 mg/dL (ref 70–99)
Potassium: 3.9 mmol/L (ref 3.5–5.1)
Sodium: 140 mmol/L (ref 135–145)

## 2023-07-20 MED ORDER — MORPHINE SULFATE (PF) 2 MG/ML IV SOLN: 1.0000 mg | INTRAVENOUS | Status: DC | PRN

## 2023-07-20 NOTE — [place] FL2 (Addendum)
 [place]  MEDICAID FL2 LEVEL OF CARE FORM     IDENTIFICATION  Patient Name: Selena Morris Birthdate: 08/15/1945 Sex: female Admission Date (Current Location): 07/18/2023  [facility] and [place] Number:  Producer, television/film/video and Address:  [facility] [place], [address] [place], [place] [address]      Provider Number: [id number]  Attending Physician Name and Address:  Md, Trauma, MD  Relative Name and Phone Number:  Rocsi, Jessel [phone]    Current Level of Care: Hospital Recommended Level of Care: Skilled Nursing Facility Prior Approval Number:    Date Approved/Denied:   PASRR Number:    Discharge Plan: SNF    Current Diagnoses: Patient Active Problem List   Diagnosis Date Noted   Fall 07/18/2023    Orientation RESPIRATION BLADDER Height & Weight     Self, Time, Situation, Place  Normal Continent Weight: 130 lb (59 kg) Height:  5' (152.4 cm)  BEHAVIORAL SYMPTOMS/MOOD NEUROLOGICAL BOWEL NUTRITION STATUS      Continent Diet (see discharge summary)  AMBULATORY STATUS COMMUNICATION OF NEEDS Skin   Limited Assist Verbally Normal                       Personal Care Assistance Level of Assistance  Bathing, Feeding, Dressing Bathing Assistance: Limited assistance Feeding assistance: Limited assistance Dressing Assistance: Limited assistance     Functional Limitations Info  Sight, Hearing, Speech Sight Info: Adequate Hearing Info: Adequate Speech Info: Adequate    SPECIAL CARE FACTORS FREQUENCY  PT (By licensed PT), OT (By licensed OT)     PT Frequency: 5x week OT Frequency: 5x week            Contractures Contractures Info: Not present    Additional Factors Info  Code Status, Allergies Code Status Info: DNR Allergies Info: Percocet (Oxycodone-acetaminophen ), Tylox (Oxycodone-acetaminophen )           Current Medications (07/20/2023):  This is the current hospital active medication list Current  Facility-Administered Medications  Medication Dose Route Frequency Provider Last Rate Last Admin   acetaminophen  (TYLENOL ) tablet 1,000 mg  1,000 mg Oral Q6H Aldean Hummingbird, MD   1,000 mg at 07/20/23 0636   buPROPion (WELLBUTRIN XL) 24 hr tablet 450 mg  450 mg Oral Daily Marlin Simmonds, PA-C   450 mg at 07/20/23 1007   cholecalciferol (VITAMIN D3) 25 MCG (1000 UNIT) tablet 1,000 Units  1,000 Units Oral q morning Aldean Hummingbird, MD   1,000 Units at 07/20/23 1007   docusate sodium (COLACE) capsule 100 mg  100 mg Oral BID Aldean Hummingbird, MD   100 mg at 07/20/23 1007   enoxaparin (LOVENOX) injection 30 mg  30 mg Subcutaneous Q12H Aldean Hummingbird, MD   30 mg at 07/20/23 1007   escitalopram (LEXAPRO) tablet 10 mg  10 mg Oral QPM Aldean Hummingbird, MD   10 mg at 07/19/23 1700   hydrALAZINE (APRESOLINE) injection 10 mg  10 mg Intravenous Q2H PRN Aldean Hummingbird, MD       melatonin tablet 3 mg  3 mg Oral QHS PRN Aldean Hummingbird, MD   3 mg at 07/20/23 0051   methocarbamol (ROBAXIN) tablet 500 mg  500 mg Oral Q8H Wilson, Eric, MD   500 mg at 07/20/23 5621   Or   methocarbamol (ROBAXIN) injection 500 mg  500 mg Intravenous Q8H Wilson, Eric, MD       metoprolol tartrate (LOPRESSOR) injection 5 mg  5 mg Intravenous Q6H PRN Elvan Hamel,  Lyell Samuel, MD       morphine (PF) 2 MG/ML injection 1 mg  1 mg Intravenous Q4H PRN Marlin Simmonds, PA-C       ondansetron  (ZOFRAN -ODT) disintegrating tablet 4 mg  4 mg Oral Q6H PRN Aldean Hummingbird, MD       Or   ondansetron  (ZOFRAN ) injection 4 mg  4 mg Intravenous Q6H PRN Aldean Hummingbird, MD       pantoprazole (PROTONIX) EC tablet 40 mg  40 mg Oral Daily Aldean Hummingbird, MD   40 mg at 07/20/23 1006   Or   pantoprazole (PROTONIX) injection 40 mg  40 mg Intravenous Daily Aldean Hummingbird, MD       polyethylene glycol (MIRALAX / GLYCOLAX) packet 17 g  17 g Oral Daily Marlin Simmonds, PA-C   17 g at 07/20/23 1007   polyvinyl alcohol (LIQUIFILM TEARS) 1.4 % ophthalmic solution 1 drop  1 drop Both Eyes PRN Aldean Hummingbird, MD        propranolol ER (INDERAL LA) 24 hr capsule 80 mg  80 mg Oral Daily Osborne, Kelly, PA-C   80 mg at 07/20/23 1007   ramipril (ALTACE) capsule 2.5 mg  2.5 mg Oral Daily Marlin Simmonds, PA-C   2.5 mg at 07/20/23 1007   simvastatin (ZOCOR) tablet 10 mg  10 mg Oral q1800 Aldean Hummingbird, MD   10 mg at 07/19/23 1700   traMADol (ULTRAM) tablet 25 mg  25 mg Oral Q6H PRN Aldean Hummingbird, MD       traMADol Marionette Sick) tablet 50 mg  50 mg Oral Q6H PRN Aldean Hummingbird, MD   50 mg at 07/20/23 1020     Discharge Medications: Please see discharge summary for a list of discharge medications.  Relevant Imaging Results:  Relevant Lab Results:   Additional Information SSN: [id number]  Elspeth Hals, LCSW

## 2023-07-20 NOTE — Progress Notes (Signed)
 Mobility Specialist Progress Note:    07/20/23 1300  Mobility  Activity Transferred to/from [facility]  Level of Assistance Moderate assist, patient does 50-74%  Assistive Device Front wheel walker  Distance Ambulated (ft) 4 ft  Activity Response Tolerated fair;Tolerated well  Mobility Referral Yes  Mobility visit 1 Mobility  Mobility Specialist Start Time (ACUTE ONLY) 1042  Mobility Specialist Stop Time (ACUTE ONLY) 1102  Mobility Specialist Time Calculation (min) (ACUTE ONLY) 20 min   Pt received in bed, requesting assistance to BR. Initially planning to ambulate into BR, though L flank pain limiting pt. Required modA to come EOB and step pivot to [facility]. Pt left on [facility] w/ call bell in hand. RN and NT aware.  D'Vante Nolon Baxter Mobility Specialist Please contact via Special educational needs teacher or Rehab office at [phone]

## 2023-07-20 NOTE — TOC Initial Note (Signed)
 Transition of Care [facility]) - Initial/Assessment Note    Patient Details  Name: Selena Morris MRN: [id number] Date of Birth: April 08, 1945  [facility] CM/SW Contact:    Elspeth Hals, LCSW Phone Number: 07/20/2023, 11:04 AM  Clinical Narrative:    CSW met with pt regarding PT recommendation for SNF.  Pt is agreeable to SNF, medicare choice document provided, permission given to send out referral in the hub.  Pt from home alone, no current services.  Permission given to speak with son Ethelle Herb.  CSW spoke with Ethelle Herb by phone and he is also in agreement.  Referral sent out in hub.  PASSR requires additional information.                 Expected Discharge Plan: Skilled Nursing Facility Barriers to Discharge: Continued Medical Work up, SNF Pending bed offer   Patient Goals and CMS Choice Patient states their goals for this hospitalization and ongoing recovery are:: "running marathons" CMS Medicare.gov Compare Post Acute Care list provided to:: Patient Choice offered to / list presented to : Patient      Expected Discharge Plan and Services In-house Referral: Clinical Social Work   Post Acute Care Choice: Skilled Nursing Facility Living arrangements for the past 2 months: Single Family Home                                      Prior Living Arrangements/Services Living arrangements for the past 2 months: Single Family Home Lives with:: Self Patient language and need for interpreter reviewed:: Yes Do you feel safe going back to the place where you live?: Yes      Need for Family Participation in Patient Care: Yes (Comment) Care giver support system in place?: Yes (comment) Current home services: Other (comment) (none) Criminal Activity/Legal Involvement Pertinent to Current Situation/Hospitalization: No - Comment as needed  Activities of Daily Living   ADL Screening (condition at time of admission) Independently performs ADLs?: Yes (appropriate  for developmental age) Is the patient deaf or have difficulty hearing?: Yes Does the patient have difficulty seeing, even when wearing glasses/contacts?: No Does the patient have difficulty concentrating, remembering, or making decisions?: No  Permission Sought/Granted Permission sought to share information with : Family Supports Permission granted to share information with : Yes, Verbal Permission Granted  Share Information with NAME: son Ethelle Herb  Permission granted to share info w AGENCY: SNF        Emotional Assessment Appearance:: Appears stated age Attitude/Demeanor/Rapport: Engaged Affect (typically observed): Appropriate, Pleasant Orientation: :  (not recorded, appears oriented)      Admission diagnosis:  Fall [W19.XXXA] Fall, initial encounter [id number]] Closed fracture of multiple ribs of left side, initial encounter [S22.42XA] Chest pain, unspecified type [R07.9] Patient Active Problem List   Diagnosis Date Noted   Fall 07/18/2023   PCP:  Pete Brand, DO Pharmacy:   [id number] [place], [place] - [address] [place] [address] Phone: [phone] Fax: [phone]  [facility] [place], [place] - [address] [place] [address] Phone: [phone] Fax: [phone]  [facility] [address] [place] [address] Phone: [phone] Fax: [phone]     Social Drivers of Health (SDOH) Social History: SDOH Screenings   Food Insecurity: No Food Insecurity (07/18/2023)  Housing: Low Risk  (07/18/2023)  Transportation Needs: No Transportation Needs (07/18/2023)  Utilities: Not At Risk (07/18/2023)  Social Connections: Socially Isolated (07/18/2023)  Tobacco Use: Medium Risk (07/18/2023)   SDOH Interventions:     Readmission Risk Interventions     No data to display

## 2023-07-20 NOTE — Care Management Important Message (Signed)
 Important Message  Patient Details  Name: Selena Morris MRN: [id number] Date of Birth: [birth year]   Important Message Given:  Yes - Medicare IM     Felix Host 07/20/2023, 10:48 AM

## 2023-07-20 NOTE — Progress Notes (Signed)
 Subjective: Voiding much better today.  No urinary complaints.  Ate well yesterday.  + flatus, no BM.  Pain seems relatively well controlled.  Work on oral meds.  Got morphine 1 mg only last night around 2130.  ROS: See above, otherwise other systems negative  Objective: Vital signs in last 24 hours: Temp:  [97.5 F (36.4 C)-98.3 F (36.8 C)] 97.5 F (36.4 C) (05/15 0409) Pulse Rate:  [60-74] 60 (05/15 0409) Resp:  [18-20] 18 (05/15 0409) BP: (97-125)/(59-66) 113/66 (05/15 0409) SpO2:  [91 %-99 %] 92 % (05/15 0409) Last BM Date : 07/17/23  Intake/Output from previous day: 05/14 0701 - 05/15 0700 In: 798.3 [P.O.:350; I.V.:448.3] Out: 775 [Urine:775] Intake/Output this shift: No intake/output data recorded.  PE: Gen: NAD, pleasant HEENT: PERRL Heart: regular Lungs: CTAB, chest wall tenderness on left side as expected Abd: soft, NT, ND GU: clear yellow urine, on bedpan right now Ext: MAEs  Psych: A&Ox3  Lab Results:  Recent Labs    07/18/23 1310 07/19/23 0615  WBC 14.2* 12.1*  HGB 15.1* 13.8  HCT 46.2* 41.4  PLT 236 210   BMET Recent Labs    07/19/23 0615 07/20/23 0726  NA 138 140  K 3.8 3.9  CL 106 109  CO2 24 24  GLUCOSE 134* 99  BUN 26* 21  CREATININE 1.20* 0.89  CALCIUM 8.3* 8.4*   PT/INR No results for input(s): "LABPROT", "INR" in the last 72 hours. CMP     Component Value Date/Time   NA 140 07/20/2023 0726   K 3.9 07/20/2023 0726   CL 109 07/20/2023 0726   CO2 24 07/20/2023 0726   GLUCOSE 99 07/20/2023 0726   BUN 21 07/20/2023 0726   CREATININE 0.89 07/20/2023 0726   CALCIUM 8.4 (L) 07/20/2023 0726   PROT 7.7 07/18/2023 1310   ALBUMIN 4.4 07/18/2023 1310   AST 19 07/18/2023 1310   ALT 20 07/18/2023 1310   ALKPHOS 91 07/18/2023 1310   BILITOT 0.9 07/18/2023 1310   GFRNONAA >60 07/20/2023 0726   GFRAA >60 11/05/2015 1305   Lipase  No results found for: "LIPASE"     Studies/Results: CT Lumbar Spine Wo Contrast Result  Date: 07/18/2023 CLINICAL DATA:  Back trauma, no prior imaging (Age >= 16y). Weakness and falls. Back pain. EXAM: CT LUMBAR SPINE WITHOUT CONTRAST TECHNIQUE: Multidetector CT imaging of the lumbar spine was performed without intravenous contrast administration. Multiplanar CT image reconstructions were also generated. RADIATION DOSE REDUCTION: This exam was performed according to the departmental dose-optimization program which includes automated exposure control, adjustment of the mA and/or kV according to patient size and/or use of iterative reconstruction technique. COMPARISON:  Lumbar spine CT 12/17/2021 FINDINGS: Segmentation: There are 5 non-rib-bearing lumbar type vertebrae and 13 rib-bearing thoracic vertebrae. Alignment: Unchanged trace anterolisthesis of L4 on L5. Slightly exaggerated lumbar lordosis. Vertebrae: No acute fracture or suspicious lesion. Paraspinal and other soft tissues: Abdominal aortic atherosclerosis without aneurysm. Disc levels: Mild spondylosis with preserved disc heights throughout the lumbar spine. Diffuse facet arthrosis which is severe at L4-5 and L5-S1. No evidence of high-grade spinal canal or neural foraminal stenosis. IMPRESSION: 1. No acute lumbar spine fracture. 2. Severe facet arthrosis at L4-5 and L5-S1. 3.  Aortic Atherosclerosis (ICD10-I70.0). Electronically Signed   By: Aundra Lee M.D.   On: 07/18/2023 16:20   CT T-SPINE NO CHARGE Result Date: 07/18/2023 CLINICAL DATA:  Trauma.  Weakness and falls.  Back pain. EXAM: CT THORACIC SPINE WITH CONTRAST  TECHNIQUE: Multiplanar CT images of the thoracic spine were reconstructed from contemporary CT of the Chest. RADIATION DOSE REDUCTION: This exam was performed according to the departmental dose-optimization program which includes automated exposure control, adjustment of the mA and/or kV according to patient size and/or use of iterative reconstruction technique. CONTRAST:  No additional. COMPARISON:  CT chest 12/17/2021  FINDINGS: Alignment: Mildly exaggerated thoracic kyphosis. No significant listhesis. Vertebrae: T12 superior endplate compression fracture with 55% vertebral body height loss anteriorly, new from 2023. Associated sclerosis without a visible acute appearing fracture line or paraspinal hematoma. 3-4 mm retropulsion of the posterosuperior T12 vertebral body. No posterior element fracture. No suspicious bone lesion. Left-sided rib fractures as described on today's separate chest CT. Paraspinal and other soft tissues: No acute abnormality in the paraspinal soft tissues. Remainder of the chest reported separately. Disc levels: Predominantly mild multilevel disc degeneration and up to moderate facet arthrosis. Mild spinal stenosis at T11-12 due to T12 retropulsion. IMPRESSION: T12 compression fracture with 55% height loss, new from 2023 but otherwise age indeterminate and favored to be subacute or chronic. Correlate with point tenderness and consider obtaining a spine MRI if there is clinical concern for a recent fracture. Slight retropulsion with mild spinal stenosis. Electronically Signed   By: Aundra Lee M.D.   On: 07/18/2023 16:16   CT Head Wo Contrast Result Date: 07/18/2023 CLINICAL DATA:  Head trauma, minor (Age >= 65y); Neck trauma (Age >= 65y). Weakness and falls. Left shoulder and arm pain. EXAM: CT HEAD WITHOUT CONTRAST CT CERVICAL SPINE WITHOUT CONTRAST TECHNIQUE: Multidetector CT imaging of the head and cervical spine was performed following the standard protocol without intravenous contrast. Multiplanar CT image reconstructions of the cervical spine were also generated. RADIATION DOSE REDUCTION: This exam was performed according to the departmental dose-optimization program which includes automated exposure control, adjustment of the mA and/or kV according to patient size and/or use of iterative reconstruction technique. COMPARISON:  CT head and cervical spine 12/17/2021 FINDINGS: CT HEAD FINDINGS Brain:  There is no evidence of an acute cortically based infarct, intracranial hemorrhage, mass, midline shift, or extra-axial fluid collection. Confluent hypodensities in the cerebral white matter bilaterally and heterogeneous hypodensities in the basal ganglia are nonspecific but compatible with severe chronic small vessel ischemic disease. 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 Mild cerebral atrophy is within normal limits for age. Vascular: Calcified atherosclerosis at the skull base. No hyperdense vessel. Skull: No acute calvarial fracture or suspicious lesion. Sinuses/Orbits: Complete opacification of the left frontal sinus and anterior and mid left ethmoid air cells. Partially visualized hyperdense fluid in the left maxillary sinus. Clear mastoid air cells. Bilateral cataract extraction. Other: None. CT CERVICAL SPINE FINDINGS Alignment: Unchanged.  No acute traumatic malalignment. Skull base and vertebrae: No acute fracture or suspicious lesion. 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 Soft tissues and spinal canal: No prevertebral fluid or swelling. No visible canal hematoma. Disc levels: Mild disc degeneration and moderate to severe multilevel facet arthrosis. No evidence of high-grade spinal canal stenosis. Advanced right neural foraminal stenosis at C4-5. Upper chest: More fully evaluated on the contemporaneous dedicated chest CT. Other: None. IMPRESSION: 1. No evidence of acute intracranial abnormality or cervical spine fracture. 2. Partially visualized hyperdense fluid/possible hemorrhage in the left maxillary sinus. A maxillofacial CT is recommended if there is any clinical concern for an acute facial fracture. 3. Severe chronic small vessel ischemic disease. Electronically Signed   By: Constantine Delude.D.  On: 07/18/2023 16:05   CT Cervical Spine Wo Contrast Result Date: 07/18/2023 CLINICAL DATA:  Head trauma, minor (Age >= 65y); Neck trauma (Age >= 65y).  Weakness and falls. Left shoulder and arm pain. EXAM: CT HEAD WITHOUT CONTRAST CT CERVICAL SPINE WITHOUT CONTRAST TECHNIQUE: Multidetector CT imaging of the head and cervical spine was performed following the standard protocol without intravenous contrast. Multiplanar CT image reconstructions of the cervical spine were also generated. RADIATION DOSE REDUCTION: This exam was performed according to the departmental dose-optimization program which includes automated exposure control, adjustment of the mA and/or kV according to patient size and/or use of iterative reconstruction technique. COMPARISON:  CT head and cervical spine 12/17/2021 FINDINGS: CT HEAD FINDINGS Brain: There is no evidence of an acute cortically based infarct, intracranial hemorrhage, mass, midline shift, or extra-axial fluid collection. Confluent hypodensities in the cerebral white matter bilaterally and heterogeneous hypodensities in the basal ganglia are nonspecific but compatible with severe chronic small vessel ischemic disease. 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 Mild cerebral atrophy is within normal limits for age. Vascular: Calcified atherosclerosis at the skull base. No hyperdense vessel. Skull: No acute calvarial fracture or suspicious lesion. Sinuses/Orbits: Complete opacification of the left frontal sinus and anterior and mid left ethmoid air cells. Partially visualized hyperdense fluid in the left maxillary sinus. Clear mastoid air cells. Bilateral cataract extraction. Other: None. CT CERVICAL SPINE FINDINGS Alignment: Unchanged.  No acute traumatic malalignment. Skull base and vertebrae: No acute fracture or suspicious lesion. 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 Soft tissues and spinal canal: No prevertebral fluid or swelling. No visible canal hematoma. Disc levels: Mild disc degeneration and moderate to severe multilevel facet arthrosis. No evidence of high-grade spinal canal  stenosis. Advanced right neural foraminal stenosis at C4-5. Upper chest: More fully evaluated on the contemporaneous dedicated chest CT. Other: None. IMPRESSION: 1. No evidence of acute intracranial abnormality or cervical spine fracture. 2. Partially visualized hyperdense fluid/possible hemorrhage in the left maxillary sinus. A maxillofacial CT is recommended if there is any clinical concern for an acute facial fracture. 3. Severe chronic small vessel ischemic disease. Electronically Signed   By: Aundra Lee M.D.   On: 07/18/2023 16:05   CT Chest W Contrast Result Date: 07/18/2023 CLINICAL DATA:  Chest trauma, blunt. Weakness with multiple falls. Left shoulder, arm, rib and back pain. EXAM: CT CHEST WITH CONTRAST TECHNIQUE: Multidetector CT imaging of the chest was performed during intravenous contrast administration. RADIATION DOSE REDUCTION: This exam was performed according to the departmental dose-optimization program which includes automated exposure control, adjustment of the mA and/or kV according to patient size and/or use of iterative reconstruction technique. CONTRAST:  75mL OMNIPAQUE  IOHEXOL  300 MG/ML  SOLN COMPARISON:  Chest radiographs 07/18/2023.  Chest CT 12/17/2021. FINDINGS: Cardiovascular: No acute vascular findings. There is mild atherosclerosis of the aorta, great vessels and coronary arteries. No evidence of mediastinal hematoma. The heart size is normal. There is no pericardial effusion. Mediastinum/Nodes: There are no enlarged mediastinal, hilar or axillary lymph nodes. The thyroid  gland, trachea and esophagus demonstrate no significant findings. Lungs/Pleura: Trace left pleural effusion. No pneumothorax. Chronic subpleural reticulation in both lungs has mildly progressed compared with the previous CT. There is mild superimposed atelectasis at both lung bases. No confluent airspace disease or suspicious nodularity. Upper abdomen: No acute findings are seen in the visualized upper abdomen.  There is aortic and branch vessel atherosclerosis. Musculoskeletal/Chest wall: Multiple acute fractures of the left ribs laterally,  involving the 3rd through 7th ribs. Most of these are nondisplaced. Minimal displacement of the 5th and 6th rib fractures. There are old right-sided rib fractures. There is a new superior endplate compression fracture at T12. Spine details dictated separately. IMPRESSION: 1. Multiple acute fractures of the left ribs laterally, involving the 3rd through 7th ribs. Most of these are nondisplaced. 2. Trace left pleural effusion. No pneumothorax. 3. New superior endplate compression fracture at T12. Spine details dictated separately. 4. No evidence of acute vascular injury or mediastinal hematoma. 5. Chronic subpleural reticulation in both lungs has mildly progressed compared with the previous CT. 6.  Aortic Atherosclerosis (ICD10-I70.0). Electronically Signed   By: Elmon Hagedorn M.D.   On: 07/18/2023 15:56   DG Chest Port 1 View Result Date: 07/18/2023 CLINICAL DATA:  fall, chest pain EXAM: PORTABLE CHEST - 1 VIEW COMPARISON:  November 05, 2015 FINDINGS: Low lung volumes. No focal airspace consolidation, pleural effusion, or pneumothorax. Biapical pleural thickening. No cardiomegaly. Tortuous aorta with aortic atherosclerosis. Remote, right-sided rib fractures. The possible rib fractures on the left are better visualized on the shoulder radiograph. Multilevel thoracic osteophytosis. IMPRESSION: No pneumonia, pulmonary edema, or pneumothorax. Electronically Signed   By: Rance Burrows M.D.   On: 07/18/2023 14:29   DG Shoulder Left Result Date: 07/18/2023 CLINICAL DATA:  fall, chest pain, left shoulder pain EXAM: LEFT SHOULDER - 2+ VIEW COMPARISON:  None Available. FINDINGS: Osteopenia. No acute fracture or dislocation of the shoulder. A couple of likely mildly displaced left lateral rib fractures are present. Soft tissues are unremarkable. IMPRESSION: A couple of mildly displaced left  lateral rib fractures are likely present. Otherwise, no acute fracture or dislocation of the shoulder. Clinical correlation requested. Electronically Signed   By: Rance Burrows M.D.   On: 07/18/2023 14:27    Anti-infectives: Anti-infectives (From admission, onward)    None        Assessment/Plan GLF, s/p multiple recent falls L rib fx 3-7 - pulm toilet, pain control, IS, mobilize with therapies T12 compression fx - NSGY, Nundkumar to see. Rec TLSO brace for now which is in the room AKI/decrease UOP - Cr has normalized today with good UOP.  Will SL today and see how she does GERD Depression - home meds Anxiety  HTN - restart home meds HLD - restart home meds H/o CVA in 2023 per patient - she states this is likely why she is on the Plavix FEN - regular, SLIV VTE - lovenox, apparently takes Plavix, will hold for now, especially given frequent falls.   ID - none currently, monitor UA Dispo - rec SNF.  D/w SW today.  Will start this process.  Patient is agreeable to this plan  I reviewed nursing notes, last 24 h vitals and pain scores, last 48 h intake and output, last 24 h labs and trends, and last 24 h imaging results.   LOS: 2 days    Selena Morris , [facility] 07/20/2023, 9:42 AM Please see Amion for pager number during day hours 7:00am-4:30pm or 7:00am -11:30am on weekends

## 2023-07-20 NOTE — Plan of Care (Signed)
  Problem: Clinical Measurements: Goal: Will remain free from infection Outcome: Not Progressing   Problem: Safety: Goal: Ability to remain free from injury will improve Outcome: Not Progressing   Problem: Pain Managment: Goal: General experience of comfort will improve and/or be controlled Outcome: Not Progressing

## 2023-07-20 NOTE — Progress Notes (Signed)
 RE:  Selena Morris       Date of Birth:  23-May-2045     Date:   07/20/23       To Whom It May Concern:  Please be advised that the above-named patient will require a short-term nursing home stay - anticipated 30 days or less for rehabilitation and strengthening.  The plan is for return home.                 MD signature                Date

## 2023-07-21 LAB — BASIC METABOLIC PANEL WITH GFR
Anion gap: 7 (ref 5–15)
BUN: 20 mg/dL (ref 8–23)
CO2: 25 mmol/L (ref 22–32)
Calcium: 8.6 mg/dL — ABNORMAL LOW (ref 8.9–10.3)
Chloride: 108 mmol/L (ref 98–111)
Creatinine, Ser: 0.82 mg/dL (ref 0.44–1.00)
GFR, Estimated: >60 mL/min (ref >60)
Glucose, Bld: 103 mg/dL — ABNORMAL HIGH (ref 70–99)
Potassium: 3.6 mmol/L (ref 3.5–5.1)
Sodium: 140 mmol/L (ref 135–145)

## 2023-07-21 NOTE — TOC Progression Note (Addendum)
 Transition of Care [facility]) - Progression Note    Patient Details  Name: OCEANE STREHLE MRN: [id number] Date of Birth: [birth year]  [facility] of [facility]) CM/SW Contact  Layci Stenglein, Avanell Leigh, RN Phone Number: 07/21/2023, 3:56 PM  Clinical Narrative:    Bed offers given to patient; she prefers [facility].  Left message for Sherrlyn Dolores in admissions at [facility].  Will follow with updates as available.   Addendum: 1601 Facility admissions returned call; they will be able to admit patient over the WE, pending PASSR number.  Forwarded requested documentation to [facility], per request.  Left handoff for weekend CSW to facilitate dc to SNF.  Patient agreeable to plan.    Expected Discharge Plan: Skilled Nursing Facility Barriers to Discharge: Continued Medical Work up, SNF Pending bed offer  Expected Discharge Plan and Services In-house Referral: Clinical Social Work   Post Acute Care Choice: Skilled Nursing Facility Living arrangements for the past 2 months: Single Family Home                                       Social Determinants of Health (SDOH) Interventions SDOH Screenings   Food Insecurity: No Food Insecurity (07/18/2023)  Housing: Low Risk  (07/18/2023)  Transportation Needs: No Transportation Needs (07/18/2023)  Utilities: Not At Risk (07/18/2023)  Social Connections: Socially Isolated (07/18/2023)  Tobacco Use: Medium Risk (07/18/2023)    Readmission Risk Interventions     No data to display         Calla Catchings, RN, BSN  Trauma/Neuro ICU Case Manager [phone]

## 2023-07-21 NOTE — Progress Notes (Signed)
 Mobility Specialist Progress Note:    07/21/23 1400  Mobility  Activity Transferred from chair to bed  Level of Assistance Minimal assist, patient does 75% or more  Assistive Device Front wheel walker  Distance Ambulated (ft) 6 ft  Activity Response Tolerated well  Mobility Referral Yes  Mobility visit 1 Mobility  Mobility Specialist Start Time (ACUTE ONLY) 1345  Mobility Specialist Stop Time (ACUTE ONLY) 1358  Mobility Specialist Time Calculation (min) (ACUTE ONLY) 13 min   Pt received in chair and agreeable. Having experienced urinary incontinence in chair, pt able to stand w/ minA as NT assisted w/ peri care. No complaints throughout and able to take steps to bed. Pt left in bed w/ NT present.  D'Vante Nolon Baxter Mobility Specialist Please contact via Special educational needs teacher or Rehab office at [phone]

## 2023-07-21 NOTE — Plan of Care (Signed)

## 2023-07-21 NOTE — Progress Notes (Signed)
 Physical Therapy Treatment Patient Details Name: Selena Morris MRN: [id number] DOB: [birth year] Today's Date: 07/21/2023   History of Present Illness Patient is 78 y.o. female presented after a fall  resulting in T12 compression fx and Lt 3-7 rib fx. Lt shoulder x-ray negative for acute injury. PMH significant for frequent falls recently, ADD, anxiety, depression, GERD, HLD, HTN, osteoporosis.    PT Comments  Pt received in supine and agreeable to session. Pt continues to demonstrate difficulty performing logroll technique to sit to EOB due to pain, requiring up to mod A. Pt requests to use the [facility] and is able to have a BM and assist with pericare. Pt able to stand and tolerate short distances in the room for transfers. Pt requires cues for sequencing throughout session. Pt continues to benefit from PT services to progress toward functional mobility goals.    If plan is discharge home, recommend the following: A little help with bathing/dressing/bathroom;A little help with walking and/or transfers;Assistance with cooking/housework;Help with stairs or ramp for entrance;Assist for transportation   Can travel by private vehicle     Yes  Equipment Recommendations  BSC/3in1    Recommendations for Other Services       Precautions / Restrictions Precautions Precautions: Fall Recall of Precautions/Restrictions: Intact Required Braces or Orthoses: Spinal Brace Spinal Brace: Thoracolumbosacral orthotic;Applied in sitting position Restrictions Weight Bearing Restrictions Per Provider Order: No     Mobility  Bed Mobility Overal bed mobility: Needs Assistance Bed Mobility: Rolling, Sidelying to Sit Rolling: Min assist Sidelying to sit: Mod assist, HOB elevated       General bed mobility comments: cues for logroll technique and min-mod A to perform due to increased pain.    Transfers Overall transfer level: Needs assistance Equipment used: Rolling walker (2 wheels) Transfers: Sit  to/from Stand, Bed to chair/wheelchair/BSC Sit to Stand: Contact guard assist   Step pivot transfers: Contact guard assist       General transfer comment: increased time and cues for sequencing, upright posture, and hand placement. step pivot from EOB to [facility]    Ambulation/Gait Ambulation/Gait assistance: Contact guard assist Gait Distance (Feet): 3 Feet Assistive device: Rolling walker (2 wheels) Gait Pattern/deviations: Step-to pattern, Shuffle, Trunk flexed Gait velocity: decr     General Gait Details: Pt able to take a few slow steps from [facility] to recliner. Cues for sequencing and UE support on RW for improved stability. CGA for safety   Stairs             Wheelchair Mobility     Tilt Bed    Modified Rankin (Stroke Patients Only)       Balance Overall balance assessment: Needs assistance Sitting-balance support: Feet supported, Bilateral upper extremity supported Sitting balance-Leahy Scale: Poor Sitting balance - Comments: sitting EOB   Standing balance support: During functional activity, Bilateral upper extremity supported, Reliant on assistive device for balance Standing balance-Leahy Scale: Poor Standing balance comment: with RW support                            Communication Communication Communication: No apparent difficulties  Cognition Arousal: Alert Behavior During Therapy: Anxious   PT - Cognitive impairments: No apparent impairments                         Following commands: Intact      Cueing Cueing Techniques: Verbal cues  Exercises  General Comments        Pertinent Vitals/Pain Pain Assessment Pain Assessment: Faces Faces Pain Scale: Hurts even more Pain Location: Lt chest/ribs Pain Descriptors / Indicators: Aching, Discomfort, Guarding, Grimacing Pain Intervention(s): Limited activity within patient's tolerance, Monitored during session, Repositioned     PT Goals (current goals can now be found  in the care plan section) Acute Rehab PT Goals Patient Stated Goal: stop hurting and stop falling PT Goal Formulation: With patient Time For Goal Achievement: 08/02/23 Progress towards PT goals: Progressing toward goals    Frequency    Min 2X/week       AM-PAC PT "6 Clicks" Mobility   Outcome Measure  Help needed turning from your back to your side while in a flat bed without using bedrails?: A Little Help needed moving from lying on your back to sitting on the side of a flat bed without using bedrails?: A Lot Help needed moving to and from a bed to a chair (including a wheelchair)?: A Little Help needed standing up from a chair using your arms (e.g., wheelchair or bedside chair)?: A Little Help needed to walk in hospital room?: A Little Help needed climbing 3-5 steps with a railing? : A Lot 6 Click Score: 16    End of Session Equipment Utilized During Treatment: Gait belt;Back brace Activity Tolerance: Patient tolerated treatment well Patient left: in chair;with call bell/phone within reach;with nursing/sitter in room Nurse Communication: Mobility status PT Visit Diagnosis: Other abnormalities of gait and mobility (R26.89);Muscle weakness (generalized) (M62.81);Difficulty in walking, not elsewhere classified (R26.2);Pain;History of falling (Z91.81);Unsteadiness on feet (R26.81)     Time: 4782-9562 PT Time Calculation (min) (ACUTE ONLY): 37 min  Charges:    $Therapeutic Activity: 23-37 mins PT General Charges $$ ACUTE PT VISIT: 1 Visit                    Michaelle Adolphus, PTA Acute Rehabilitation Services Secure Chat Preferred  [phone]    Michaelle Adolphus 07/21/2023, 10:15 AM

## 2023-07-21 NOTE — Plan of Care (Signed)

## 2023-07-21 NOTE — Progress Notes (Signed)
       Subjective: No new complaints.  Eating well.  No BM yet.  Son at bedside today  ROS: See above, otherwise other systems negative  Objective: Vital signs in last 24 hours: Temp:  [97.7 F (36.5 C)-98.7 F (37.1 C)] 98.7 F (37.1 C) (05/16 0910) Pulse Rate:  [50-61] 53 (05/16 0910) Resp:  [16-17] 17 (05/16 0910) BP: (122-168)/(66-97) 168/95 (05/16 0910) SpO2:  [90 %-100 %] 100 % (05/16 0910) Last BM Date : 07/20/23  Intake/Output from previous day: 05/15 0701 - 05/16 0700 In: 720 [P.O.:720] Out: 800 [Urine:800] Intake/Output this shift: No intake/output data recorded.  PE: Gen: NAD, pleasant HEENT: PERRL Heart: regular Lungs: CTAB, chest wall tenderness on left side as expected Abd: soft, NT, ND Ext: MAEs  Psych: A&Ox3  Lab Results:  Recent Labs    07/18/23 1310 07/19/23 0615  WBC 14.2* 12.1*  HGB 15.1* 13.8  HCT 46.2* 41.4  PLT 236 210   BMET Recent Labs    07/20/23 0726 07/21/23 0426  NA 140 140  K 3.9 3.6  CL 109 108  CO2 24 25  GLUCOSE 99 103*  BUN 21 20  CREATININE 0.89 0.82  CALCIUM 8.4* 8.6*   PT/INR No results for input(s): "LABPROT", "INR" in the last 72 hours. CMP     Component Value Date/Time   NA 140 07/21/2023 0426   K 3.6 07/21/2023 0426   CL 108 07/21/2023 0426   CO2 25 07/21/2023 0426   GLUCOSE 103 (H) 07/21/2023 0426   BUN 20 07/21/2023 0426   CREATININE 0.82 07/21/2023 0426   CALCIUM 8.6 (L) 07/21/2023 0426   PROT 7.7 07/18/2023 1310   ALBUMIN 4.4 07/18/2023 1310   AST 19 07/18/2023 1310   ALT 20 07/18/2023 1310   ALKPHOS 91 07/18/2023 1310   BILITOT 0.9 07/18/2023 1310   GFRNONAA >60 07/21/2023 0426   GFRAA >60 11/05/2015 1305   Lipase  No results found for: "LIPASE"     Studies/Results: No results found.   Anti-infectives: Anti-infectives (From admission, onward)    None        Assessment/Plan GLF, s/p multiple recent falls L rib fx 3-7 - pulm toilet, pain control, IS, mobilize with  therapies T12 compression fx - NSGY, Nundkumar to see. Rec TLSO brace for now which is in the room AKI/decrease UOP - Cr has normalized today with good UOP.  Will SL today and see how she does GERD Depression - home meds Anxiety  HTN - restart home meds HLD - restart home meds H/o CVA in 2023 per patient - she states this is likely why she is on the Plavix FEN - regular, SLIV VTE - lovenox, apparently takes Plavix, will hold for now, especially given frequent falls.   ID - none currently, monitor UA Dispo - rec SNF.  Awaiting bed choice and insurance auth.  Medically stable for DC when this is completed.  I reviewed nursing notes, last 24 h vitals and pain scores, last 48 h intake and output, last 24 h labs and trends, and last 24 h imaging results.   LOS: 3 days    Leone Ralphs , [facility] Surgery 07/21/2023, 10:37 AM Please see Amion for pager number during day hours 7:00am-4:30pm or 7:00am -11:30am on weekends

## 2023-07-21 NOTE — Progress Notes (Signed)
 Transition of Care [facility]) - CAGE-AID Screening   Patient Details  Name: Selena Morris MRN: [id number] Date of Birth: 1945/12/26  Asa Bjork, RN Trauma Response Nurse Phone Number: [phone] 07/21/2023, 5:55 PM   CAGE-AID Screening:    Have You Ever Felt You Ought to Cut Down on Your Drinking or Drug Use?: No Have People Annoyed You By Critizing Your Drinking Or Drug Use?: No Have You Felt Bad Or Guilty About Your Drinking Or Drug Use?: No Have You Ever Had a Drink or Used Drugs First Thing In The Morning to Steady Your Nerves or to Get Rid of a Hangover?: No CAGE-AID Score: 0  Substance Abuse Education Offered: (S) No (denies drinking alcohol- no services necessary)

## 2023-07-22 ENCOUNTER — Other Ambulatory Visit: Payer: Self-pay

## 2023-07-22 MED ORDER — ACETAMINOPHEN 500 MG PO TABS: 1000.0000 mg | ORAL_TABLET | Freq: Four times a day (QID) | ORAL | Status: AC | PRN

## 2023-07-22 MED ORDER — TRAMADOL HCL 50 MG PO TABS: 50.0000 mg | ORAL_TABLET | Freq: Four times a day (QID) | ORAL | 0 refills | Status: AC | PRN

## 2023-07-22 NOTE — Plan of Care (Signed)

## 2023-07-22 NOTE — Progress Notes (Signed)
 Pt claims that her phone was missing. Pt shared to the nursing staff that 'it might got caught in with the linens since they've been changing my bed several times yesterday.' Laundry and linen department called and stated that they found 2 phones but hand it over to the security. Security was called and shared the description of the pt's missing phone. Security told RN that no phone with such description was found.

## 2023-07-22 NOTE — Discharge Summary (Signed)
 Patient ID: Selena Morris [id number] 12-20-1945 78 y.o.  Admit date: 07/18/2023 Discharge date: 07/22/2023  Admitting Diagnosis: S/p multiple falls L rib fx 3-7 T12 compression fx Urinary frequency/incontinence H/o CVA Hypertension,  Hypercholesterolemia  Discharge Diagnosis Patient Active Problem List   Diagnosis Date Noted   Fall 07/18/2023  multiple recent falls L rib fx 3-7 T12 compression fx AKI/decrease UOP  GERD Depression  Anxiety  HTN  HLD  H/o CVA in 2023 per patient  Consultants Dr. Nat Badger - NSGY  Reason for Admission: Selena Morris is an 78 y.o. female who is here for evaluation after suffering several falls over the past few days.  History is provided by the patient and her son.  Patient reports falling on Thursday, Friday, Sunday and yesterday.  She fell Thursday while taking the trash out and trying to lift the gate with the other hand.  She has a remote history of stroke from a few years ago.  Past medical history also includes hypertension, hypercholesterolemia-not on any blood pressure medication per patient.  After falling yesterday she experienced worsening pain.  She had been managing her pain from her recent falls with 2 tablets of Aleve twice a day.  No blood thinners.  She does report frequent urination and incontinence over the past couple of days.  She denies any dysuria or hematuria.  She denies any lightheadedness or dizziness preceding the falls.  She states that she has had balance issues since her stroke.  She has not seen a neurologist as an outpatient.  Her PCP is Dr. Hazeline Lister with [facility] denies any abdominal pain, bilateral extremity pain, bilateral upper extremity pain, central neck pain, jaw pain, blurry vision.   SHe endorses central back pain without radiculopathy as well as some left lateral chest wall pain.   She was evaluated and worked up by the emergency room and found to have multiple rib  fractures as well as a T12 compression fracture and we were called for evaluation and probable admission.  ER already spoke with neurosurgery who recommended starting management of her T12 compression fraction with a TLSO brace  Procedures none  Hospital Course:  Multiple recent falls  L rib fx 3-7  Pulm toilet, pain control, IS, mobilize with therapies  T12 compression fx NSGY, Nundkumar. Rec TLSO brace and mobilization.  She worked with therapies who recommended SNF placement.  AKI/decrease UOP  Cr has normalized with good UOP.  Good oral intake with good UOP.  GERD  Depression  Home meds  Anxiety   HTN  Restart home meds  HLD  Restart home meds  H/o CVA in 2023 per patient May restart Plavix; however, would give serious consideration to risk vs benefit of this given numerous falls and significant fall risk.  Physical Exam: Gen: NAD, pleasant HEENT: PERRL Heart: regular Lungs: CTAB, chest wall tenderness on left side as expected Abd: soft, NT, ND Ext: MAEs  Psych: A&Ox3  Allergies as of 07/22/2023       Reactions   Percocet [oxycodone-acetaminophen ] Rash   Tylox [oxycodone-acetaminophen ] Rash   Rash=tylox        Medication List     STOP taking these medications    simvastatin  10 MG tablet Commonly known as: ZOCOR        TAKE these medications    acetaminophen  500 MG tablet Commonly known as: TYLENOL  Take 2 tablets (1,000 mg total) by mouth every 6 (six) hours as needed.   buPROPion   150 MG 24 hr tablet Commonly known as: WELLBUTRIN  XL Take 450 mg by mouth daily.   clopidogrel 75 MG tablet Commonly known as: PLAVIX Take 75 mg by mouth daily.   escitalopram  20 MG tablet Commonly known as: LEXAPRO  Take 10 mg by mouth daily.   naproxen sodium 220 MG tablet Commonly known as: ALEVE Take 440 mg by mouth every 4 (four) hours as needed (pain).   omeprazole 20 MG capsule Commonly known as: PRILOSEC Take 20 mg by mouth every morning.    PATADAY OP Place into both eyes as needed (dry eye/allergies).   propranolol  ER 80 MG 24 hr capsule Commonly known as: INDERAL  LA Take 80 mg by mouth daily.   ramipril  2.5 MG capsule Commonly known as: ALTACE  Take 2.5 mg by mouth daily.   rosuvastatin  20 MG tablet Commonly known as: CRESTOR  Take 20 mg by mouth daily.   traMADol  50 MG tablet Commonly known as: ULTRAM  Take 1 tablet (50 mg total) by mouth every 6 (six) hours as needed (pain).   Vitamin D -3 125 MCG (5000 UT) Tabs Take 5,000 Units by mouth daily.          Follow-up Information     Augusto Blonder, MD. Call in 2 week(s).   Specialty: Neurosurgery Why: Schedule an appointment to follow up for your back fracture Contact information: [address] [place] [address] [phone]         Pete Brand, DO Follow up.   Specialty: Family Medicine Why: As needed Contact information: [address] [place] [address] [phone]                 Signed: Marlin Simmonds, [facility] Surgery 07/22/2023, 11:28 AM Please see Amion for pager number during day hours 7:00am-4:30pm, 7-11:30am on Weekends

## 2023-07-22 NOTE — TOC Progression Note (Signed)
 Transition of Care [facility]) - Progression Note    Patient Details  Name: GITTEL MCCAMISH MRN: [id number] Date of Birth: 06-Mar-1946  Transition of Care [facility]) CM/SW Contact  Adline Hook, [place] Phone Number: 07/22/2023, 10:42 AM  Clinical Narrative:     Csw confirmed that patients Pasarr is pending.  Expected Discharge Plan: Skilled Nursing Facility Barriers to Discharge: Continued Medical Work up, SNF Pending bed offer  Expected Discharge Plan and Services In-house Referral: Clinical Social Work   Post Acute Care Choice: Skilled Nursing Facility Living arrangements for the past 2 months: Single Family Home                                       Social Determinants of Health (SDOH) Interventions SDOH Screenings   Food Insecurity: No Food Insecurity (07/18/2023)  Housing: Low Risk  (07/18/2023)  Transportation Needs: No Transportation Needs (07/18/2023)  Utilities: Not At Risk (07/18/2023)  Social Connections: Socially Isolated (07/18/2023)  Tobacco Use: Medium Risk (07/18/2023)    Readmission Risk Interventions     No data to display

## 2023-07-23 NOTE — Plan of Care (Signed)

## 2023-07-23 NOTE — Progress Notes (Signed)
       Subjective: Didn't go to SNF yesterday as apparently her PASSR is pending  Objective: Vital signs in last 24 hours: Temp:  [98.2 F (36.8 C)-98.3 F (36.8 C)] 98.3 F (36.8 C) (05/18 1208) Pulse Rate:  [52-60] 60 (05/18 1208) Resp:  [16] 16 (05/18 1208) BP: (112-153)/(82-89) 112/82 (05/18 1208) SpO2:  [91 %-97 %] 93 % (05/18 1208) Last BM Date : 07/21/23  Intake/Output from previous day: 05/17 0701 - 05/18 0700 In: 960 [P.O.:960] Out: 2200 [Urine:2200] Intake/Output this shift: No intake/output data recorded.  PE: Gen: NAD, pleasant HEENT: PERRL Heart: regular Lungs: CTAB, chest wall tenderness on left side as expected Abd: soft, NT, ND Ext: MAEs  Psych: A&Ox3  Lab Results:  No results for input(s): "WBC", "HGB", "HCT", "PLT" in the last 72 hours.  BMET Recent Labs    07/21/23 0426  NA 140  K 3.6  CL 108  CO2 25  GLUCOSE 103*  BUN 20  CREATININE 0.82  CALCIUM  8.6*   PT/INR No results for input(s): "LABPROT", "INR" in the last 72 hours. CMP     Component Value Date/Time   NA 140 07/21/2023 0426   K 3.6 07/21/2023 0426   CL 108 07/21/2023 0426   CO2 25 07/21/2023 0426   GLUCOSE 103 (H) 07/21/2023 0426   BUN 20 07/21/2023 0426   CREATININE 0.82 07/21/2023 0426   CALCIUM  8.6 (L) 07/21/2023 0426   PROT 7.7 07/18/2023 1310   ALBUMIN 4.4 07/18/2023 1310   AST 19 07/18/2023 1310   ALT 20 07/18/2023 1310   ALKPHOS 91 07/18/2023 1310   BILITOT 0.9 07/18/2023 1310   GFRNONAA >60 07/21/2023 0426   GFRAA >60 11/05/2015 1305   Lipase  No results found for: "LIPASE"     Studies/Results: No results found.   Anti-infectives: Anti-infectives (From admission, onward)    None        Assessment/Plan GLF, s/p multiple recent falls L rib fx 3-7 - pulm toilet, pain control, IS, mobilize with therapies T12 compression fx - NSGY, Nundkumar to see. Rec TLSO brace for now which is in the room AKI/decrease UOP - Cr has normalized today with  good UOP.  Will SL today and see how she does GERD Depression - home meds Anxiety  HTN - restart home meds HLD - restart home meds H/o CVA in 2023 per patient - she states this is likely why she is on the Plavix FEN - regular, SLIV VTE - lovenox , apparently takes Plavix, will hold for now, especially given frequent falls.   ID - none currently, monitor UA Dispo - stable for SNF.  Can go when PASSR approved  I reviewed nursing notes, last 24 h vitals and pain scores, last 48 h intake and output, last 24 h labs and trends, and last 24 h imaging results.   LOS: 5 days    Selena Morris , [facility] Surgery 07/23/2023, 12:19 PM Please see Amion for pager number during day hours 7:00am-4:30pm or 7:00am -11:30am on weekends

## 2023-07-24 NOTE — Plan of Care (Signed)
  Problem: Coping: Goal: Level of anxiety will decrease Outcome: Adequate for Discharge   Problem: Elimination: Goal: Will not experience complications related to bowel motility Outcome: Adequate for Discharge   Problem: Safety: Goal: Ability to remain free from injury will improve Outcome: Adequate for Discharge   Problem: Skin Integrity: Goal: Risk for impaired skin integrity will decrease Outcome: Adequate for Discharge

## 2023-07-24 NOTE — TOC Progression Note (Addendum)
 Transition of Care [facility]) - Progression Note    Patient Details  Name: DIANNIE WILLNER MRN: [id number] Date of Birth: 09-Apr-1945  Transition of Care [facility]) CM/SW Contact  Dannah Ryles E Alaysha Jefcoat, LCSW Phone Number: 07/24/2023, 8:52 AM  Clinical Narrative:    Sherel Dikes is still pending. Will continue to follow.  3:30- PASRR is still pending.   Expected Discharge Plan: Skilled Nursing Facility Barriers to Discharge: Continued Medical Work up, SNF Pending bed offer  Expected Discharge Plan and Services In-house Referral: Clinical Social Work   Post Acute Care Choice: Skilled Nursing Facility Living arrangements for the past 2 months: Single Family Home Expected Discharge Date: 07/24/23                                     Social Determinants of Health (SDOH) Interventions SDOH Screenings   Food Insecurity: No Food Insecurity (07/18/2023)  Housing: Low Risk  (07/18/2023)  Transportation Needs: No Transportation Needs (07/18/2023)  Utilities: Not At Risk (07/18/2023)  Social Connections: Socially Isolated (07/18/2023)  Tobacco Use: Medium Risk (07/18/2023)    Readmission Risk Interventions     No data to display

## 2023-07-24 NOTE — Progress Notes (Signed)
 Physical Therapy Treatment Patient Details Name: Selena Morris MRN: [id number] DOB: 07-Jul-1945 Today's Date: 07/24/2023   History of Present Illness Patient is 78 y.o. female presented after a fall  resulting in T12 compression fx and Lt 3-7 rib fx. Lt shoulder x-ray negative for acute injury. PMH significant for frequent falls recently, ADD, anxiety, depression, GERD, HLD, HTN, osteoporosis.    PT Comments  Pt received on BSC and agreeable to session. Pt requires assist with pericare in standing. Pt able to tolerate increased gait distance this session with CGA for safety. Pt requires frequent short standing rest breaks due to fatigue and pain. Pt continues to benefit from PT services to progress toward functional mobility goals.    If plan is discharge home, recommend the following: A little help with bathing/dressing/bathroom;A little help with walking and/or transfers;Assistance with cooking/housework;Help with stairs or ramp for entrance;Assist for transportation   Can travel by private vehicle     Yes  Equipment Recommendations  BSC/3in1    Recommendations for Other Services       Precautions / Restrictions Precautions Precautions: Fall Recall of Precautions/Restrictions: Intact Required Braces or Orthoses: Spinal Brace Spinal Brace: Thoracolumbosacral orthotic;Applied in sitting position Restrictions Weight Bearing Restrictions Per Provider Order: No     Mobility  Bed Mobility               General bed mobility comments: Pt OOB upon entry    Transfers Overall transfer level: Needs assistance Equipment used: Rolling walker (2 wheels) Transfers: Sit to/from Stand Sit to Stand: Contact guard assist           General transfer comment: STS from [facility] with cues for hand placement and CGA for safety. Increased time to reach an upright posture    Ambulation/Gait Ambulation/Gait assistance: Contact guard assist Gait Distance (Feet): 20 Feet Assistive  device: Rolling walker (2 wheels) Gait Pattern/deviations: Step-through pattern, Decreased stride length, Trunk flexed Gait velocity: decr     General Gait Details: slow step-through pattern with cues for upright posture. Frequent short standing rest breaks.   Stairs             Wheelchair Mobility     Tilt Bed    Modified Rankin (Stroke Patients Only)       Balance Overall balance assessment: Needs assistance Sitting-balance support: Feet supported, Bilateral upper extremity supported Sitting balance-Leahy Scale: Fair Sitting balance - Comments: sitting EOB   Standing balance support: During functional activity, Bilateral upper extremity supported, Reliant on assistive device for balance Standing balance-Leahy Scale: Poor Standing balance comment: with RW support                            Communication Communication Communication: No apparent difficulties  Cognition Arousal: Alert Behavior During Therapy: WFL for tasks assessed/performed   PT - Cognitive impairments: No apparent impairments                         Following commands: Intact      Cueing Cueing Techniques: Verbal cues  Exercises      General Comments General comments (skin integrity, edema, etc.): Pt on BSC upon arrival and was able to void and have BM      Pertinent Vitals/Pain Pain Assessment Pain Assessment: Faces Faces Pain Scale: Hurts little more Pain Location: Lt chest/ribs Pain Descriptors / Indicators: Aching, Discomfort, Guarding, Grimacing Pain Intervention(s): Monitored during session, Repositioned  PT Goals (current goals can now be found in the care plan section) Acute Rehab PT Goals Patient Stated Goal: stop hurting and stop falling PT Goal Formulation: With patient Time For Goal Achievement: 08/02/23 Progress towards PT goals: Progressing toward goals    Frequency    Min 2X/week       AM-PAC PT "6 Clicks" Mobility   Outcome  Measure  Help needed turning from your back to your side while in a flat bed without using bedrails?: A Little Help needed moving from lying on your back to sitting on the side of a flat bed without using bedrails?: A Lot Help needed moving to and from a bed to a chair (including a wheelchair)?: A Little Help needed standing up from a chair using your arms (e.g., wheelchair or bedside chair)?: A Little Help needed to walk in hospital room?: A Little Help needed climbing 3-5 steps with a railing? : A Lot 6 Click Score: 16    End of Session Equipment Utilized During Treatment: Gait belt;Back brace Activity Tolerance: Patient tolerated treatment well Patient left: in chair;with call bell/phone within reach Nurse Communication: Mobility status PT Visit Diagnosis: Other abnormalities of gait and mobility (R26.89);Muscle weakness (generalized) (M62.81);Difficulty in walking, not elsewhere classified (R26.2);Pain;History of falling (Z91.81);Unsteadiness on feet (R26.81)     Time: 1478-2956 PT Time Calculation (min) (ACUTE ONLY): 15 min  Charges:    $Gait Training: 8-22 mins PT General Charges $$ ACUTE PT VISIT: 1 Visit                     Michaelle Adolphus, PTA Acute Rehabilitation Services Secure Chat Preferred  [phone]    Michaelle Adolphus 07/24/2023, 1:11 PM

## 2023-07-24 NOTE — Progress Notes (Signed)
       Subjective: No complaints.  Remains stable  Objective: Vital signs in last 24 hours: Temp:  [97.5 F (36.4 C)-98.4 F (36.9 C)] 97.5 F (36.4 C) (05/19 0735) Pulse Rate:  [49-80] 49 (05/19 0735) Resp:  [16-18] 16 (05/19 0735) BP: (112-138)/(72-88) 138/72 (05/19 0735) SpO2:  [93 %] 93 % (05/19 0735) Last BM Date : 07/23/23  Intake/Output from previous day: 05/18 0701 - 05/19 0700 In: 960 [P.O.:960] Out: -  Intake/Output this shift: Total I/O In: 240 [P.O.:240] Out: -   PE: Gen: NAD, pleasant, up in chair with brace on HEENT: PERRL Lungs: effort nonlabored on RA Ext: MAEs  Psych: A&Ox3  Lab Results:  No results for input(s): "WBC", "HGB", "HCT", "PLT" in the last 72 hours.  BMET No results for input(s): "NA", "K", "CL", "CO2", "GLUCOSE", "BUN", "CREATININE", "CALCIUM " in the last 72 hours.  PT/INR No results for input(s): "LABPROT", "INR" in the last 72 hours. CMP     Component Value Date/Time   NA 140 07/21/2023 0426   K 3.6 07/21/2023 0426   CL 108 07/21/2023 0426   CO2 25 07/21/2023 0426   GLUCOSE 103 (H) 07/21/2023 0426   BUN 20 07/21/2023 0426   CREATININE 0.82 07/21/2023 0426   CALCIUM  8.6 (L) 07/21/2023 0426   PROT 7.7 07/18/2023 1310   ALBUMIN 4.4 07/18/2023 1310   AST 19 07/18/2023 1310   ALT 20 07/18/2023 1310   ALKPHOS 91 07/18/2023 1310   BILITOT 0.9 07/18/2023 1310   GFRNONAA >60 07/21/2023 0426   GFRAA >60 11/05/2015 1305   Lipase  No results found for: "LIPASE"     Studies/Results: No results found.   Anti-infectives: Anti-infectives (From admission, onward)    None        Assessment/Plan GLF, s/p multiple recent falls L rib fx 3-7 - pulm toilet, pain control, IS, mobilize with therapies T12 compression fx - NSGY, Nundkumar to see. Rec TLSO brace for now which is in the room AKI/decrease UOP - Cr has normalized today with good UOP.  Will SL today and see how she does GERD Depression - home meds Anxiety  HTN -  restart home meds HLD - restart home meds H/o CVA in 2023 per patient - she states this is likely why she is on the Plavix FEN - regular, SLIV VTE - lovenox , apparently takes Plavix, will hold for now, especially given frequent falls.   ID - none currently, monitor UA Dispo - remains stable for SNF.  Can go when PASSR approved  I reviewed nursing notes, last 24 h vitals and pain scores, last 48 h intake and output, last 24 h labs and trends, and last 24 h imaging results.   LOS: 6 days    Leone Ralphs , [facility] Surgery 07/24/2023, 10:42 AM Please see Amion for pager number during day hours 7:00am-4:30pm or 7:00am -11:30am on weekends

## 2023-07-25 DIAGNOSIS — F331 Major depressive disorder, recurrent, moderate: Secondary | ICD-10-CM | POA: Diagnosis not present

## 2023-07-25 DIAGNOSIS — S2242XD Multiple fractures of ribs, left side, subsequent encounter for fracture with routine healing: Secondary | ICD-10-CM | POA: Diagnosis not present

## 2023-07-25 DIAGNOSIS — S22088A Other fracture of T11-T12 vertebra, initial encounter for closed fracture: Secondary | ICD-10-CM | POA: Diagnosis not present

## 2023-07-25 DIAGNOSIS — Z7901 Long term (current) use of anticoagulants: Secondary | ICD-10-CM | POA: Diagnosis not present

## 2023-07-25 DIAGNOSIS — R269 Unspecified abnormalities of gait and mobility: Secondary | ICD-10-CM | POA: Diagnosis not present

## 2023-07-25 DIAGNOSIS — Z8673 Personal history of transient ischemic attack (TIA), and cerebral infarction without residual deficits: Secondary | ICD-10-CM | POA: Diagnosis not present

## 2023-07-25 DIAGNOSIS — F329 Major depressive disorder, single episode, unspecified: Secondary | ICD-10-CM | POA: Diagnosis not present

## 2023-07-25 DIAGNOSIS — N179 Acute kidney failure, unspecified: Secondary | ICD-10-CM | POA: Diagnosis not present

## 2023-07-25 DIAGNOSIS — F411 Generalized anxiety disorder: Secondary | ICD-10-CM | POA: Diagnosis not present

## 2023-07-25 DIAGNOSIS — S22000A Wedge compression fracture of unspecified thoracic vertebra, initial encounter for closed fracture: Secondary | ICD-10-CM | POA: Diagnosis not present

## 2023-07-25 DIAGNOSIS — R0781 Pleurodynia: Secondary | ICD-10-CM | POA: Diagnosis not present

## 2023-07-25 DIAGNOSIS — Z7401 Bed confinement status: Secondary | ICD-10-CM | POA: Diagnosis not present

## 2023-07-25 DIAGNOSIS — I10 Essential (primary) hypertension: Secondary | ICD-10-CM | POA: Diagnosis not present

## 2023-07-25 DIAGNOSIS — S22080A Wedge compression fracture of T11-T12 vertebra, initial encounter for closed fracture: Secondary | ICD-10-CM | POA: Diagnosis not present

## 2023-07-25 DIAGNOSIS — F32A Depression, unspecified: Secondary | ICD-10-CM | POA: Diagnosis not present

## 2023-07-25 DIAGNOSIS — R296 Repeated falls: Secondary | ICD-10-CM | POA: Diagnosis not present

## 2023-07-25 DIAGNOSIS — F988 Other specified behavioral and emotional disorders with onset usually occurring in childhood and adolescence: Secondary | ICD-10-CM | POA: Diagnosis not present

## 2023-07-25 DIAGNOSIS — S22080D Wedge compression fracture of T11-T12 vertebra, subsequent encounter for fracture with routine healing: Secondary | ICD-10-CM | POA: Diagnosis not present

## 2023-07-25 DIAGNOSIS — F429 Obsessive-compulsive disorder, unspecified: Secondary | ICD-10-CM | POA: Diagnosis not present

## 2023-07-25 DIAGNOSIS — K219 Gastro-esophageal reflux disease without esophagitis: Secondary | ICD-10-CM | POA: Diagnosis not present

## 2023-07-25 DIAGNOSIS — H04123 Dry eye syndrome of bilateral lacrimal glands: Secondary | ICD-10-CM | POA: Diagnosis not present

## 2023-07-25 DIAGNOSIS — Z9181 History of falling: Secondary | ICD-10-CM | POA: Diagnosis not present

## 2023-07-25 DIAGNOSIS — E559 Vitamin D deficiency, unspecified: Secondary | ICD-10-CM | POA: Diagnosis not present

## 2023-07-25 DIAGNOSIS — M546 Pain in thoracic spine: Secondary | ICD-10-CM | POA: Diagnosis not present

## 2023-07-25 DIAGNOSIS — N178 Other acute kidney failure: Secondary | ICD-10-CM | POA: Diagnosis not present

## 2023-07-25 DIAGNOSIS — F419 Anxiety disorder, unspecified: Secondary | ICD-10-CM | POA: Diagnosis not present

## 2023-07-25 DIAGNOSIS — E785 Hyperlipidemia, unspecified: Secondary | ICD-10-CM | POA: Diagnosis not present

## 2023-07-25 DIAGNOSIS — S2242XA Multiple fractures of ribs, left side, initial encounter for closed fracture: Secondary | ICD-10-CM | POA: Diagnosis not present

## 2023-07-25 DIAGNOSIS — Z66 Do not resuscitate: Secondary | ICD-10-CM | POA: Diagnosis not present

## 2023-07-25 LAB — CREATININE, SERUM
Creatinine, Ser: 0.94 mg/dL (ref 0.44–1.00)
GFR, Estimated: >60 mL/min (ref >60)

## 2023-07-25 NOTE — Progress Notes (Signed)
 Mobility Specialist Progress Note:    07/25/23 1000  Mobility  Activity Transferred to/from [facility]  Level of Assistance Minimal assist, patient does 75% or more  Assistive Device Front wheel walker  Distance Ambulated (ft) 3 ft  Activity Response Tolerated well  Mobility Referral Yes  Mobility visit 1 Mobility  Mobility Specialist Start Time (ACUTE ONLY) 0910  Mobility Specialist Stop Time (ACUTE ONLY) 0925  Mobility Specialist Time Calculation (min) (ACUTE ONLY) 15 min   Pt received in bed and agreeable. Requesting assistance to [facility]. Required minA to come EOB and stand pivot to [facility]. C/o general pain on L side. Pt left on [facility] w/ call bell in hand. RN & NT notified.  D'Vante Nolon Baxter Mobility Specialist Please contact via Special educational needs teacher or Rehab office at [phone]

## 2023-07-25 NOTE — Discharge Summary (Signed)
 Patient ID: Selena Morris [id number] Mar 23, 1945 78 y.o.  Admit date: 07/18/2023 Discharge date: 07/25/2023  Admitting Diagnosis: S/p multiple falls L rib fx 3-7 T12 compression fx Urinary frequency/incontinence H/o CVA Hypertension,  Hypercholesterolemia  Discharge Diagnosis Patient Active Problem List   Diagnosis Date Noted   Fall 07/18/2023  multiple recent falls L rib fx 3-7 T12 compression fx AKI/decrease UOP  GERD Depression  Anxiety  HTN  HLD  H/o CVA in 2023 per patient  Consultants Dr. Nat Badger - NSGY   Reason for Admission: Selena Morris is an 78 y.o. female who is here for evaluation after suffering several falls over the past few days.  History is provided by the patient and her son.  Patient reports falling on Thursday, Friday, Sunday and yesterday.  She fell Thursday while taking the trash out and trying to lift the gate with the other hand.  She has a remote history of stroke from a few years ago.  Past medical history also includes hypertension, hypercholesterolemia-not on any blood pressure medication per patient.  After falling yesterday she experienced worsening pain.  She had been managing her pain from her recent falls with 2 tablets of Aleve twice a day.  No blood thinners.  She does report frequent urination and incontinence over the past couple of days.  She denies any dysuria or hematuria.  She denies any lightheadedness or dizziness preceding the falls.  She states that she has had balance issues since her stroke.  She has not seen a neurologist as an outpatient.  Her PCP is Dr. Hazeline Lister with [facility] denies any abdominal pain, bilateral extremity pain, bilateral upper extremity pain, central neck pain, jaw pain, blurry vision.   SHe endorses central back pain without radiculopathy as well as some left lateral chest wall pain.   She was evaluated and worked up by the emergency room and found to have multiple rib  fractures as well as a T12 compression fracture and we were called for evaluation and probable admission.  ER already spoke with neurosurgery who recommended starting management of her T12 compression fraction with a TLSO brace  Procedures none  Hospital Course:  Multiple recent falls   L rib fx 3-7  Pulm toilet, pain control, IS, mobilize with therapies   T12 compression fx NSGY, Nundkumar. Rec TLSO brace and mobilization.  She worked with therapies who recommended SNF placement.   AKI/decrease UOP  Cr has normalized with good UOP.  Good oral intake with good UOP.   GERD   Depression  Home meds   Anxiety    HTN  Restart home meds   HLD  Restart home meds   H/o CVA in 2023 per patient May restart Plavix; however, would give serious consideration to risk vs benefit of this given numerous falls and significant fall risk.  The patient is medically stable on HD 7 for SNF.   Allergies as of 07/25/2023       Reactions   Percocet [oxycodone-acetaminophen ] Rash   Tylox [oxycodone-acetaminophen ] Rash   Rash=tylox        Medication List     STOP taking these medications    simvastatin  10 MG tablet Commonly known as: ZOCOR        TAKE these medications    acetaminophen  500 MG tablet Commonly known as: TYLENOL  Take 2 tablets (1,000 mg total) by mouth every 6 (six) hours as needed.   buPROPion  150 MG 24 hr tablet Commonly known  as: WELLBUTRIN  XL Take 450 mg by mouth daily.   clopidogrel 75 MG tablet Commonly known as: PLAVIX Take 75 mg by mouth daily.   escitalopram  20 MG tablet Commonly known as: LEXAPRO  Take 10 mg by mouth daily.   naproxen sodium 220 MG tablet Commonly known as: ALEVE Take 440 mg by mouth every 4 (four) hours as needed (pain).   omeprazole 20 MG capsule Commonly known as: PRILOSEC Take 20 mg by mouth every morning.   PATADAY OP Place into both eyes as needed (dry eye/allergies).   propranolol  ER 80 MG 24 hr capsule Commonly  known as: INDERAL  LA Take 80 mg by mouth daily.   ramipril  2.5 MG capsule Commonly known as: ALTACE  Take 2.5 mg by mouth daily.   rosuvastatin  20 MG tablet Commonly known as: CRESTOR  Take 20 mg by mouth daily.   traMADol  50 MG tablet Commonly known as: ULTRAM  Take 1 tablet (50 mg total) by mouth every 6 (six) hours as needed (pain).   Vitamin D -3 125 MCG (5000 UT) Tabs Take 5,000 Units by mouth daily.          Contact information for follow-up providers     Augusto Blonder, MD. Call in 2 week(s).   Specialty: Neurosurgery Why: Schedule an appointment to follow up for your back fracture Contact information: [address] [place] [address] [phone]         Pete Brand, DO Follow up.   Specialty: Family Medicine Why: As needed Contact information: [address] [place] [address] [phone]              Contact information for after-discharge care     Destination     [facility] [place] Preferred SNF .   Service: Skilled Nursing Contact information: [address] [place]  [phone]                     Signed: Marlin Simmonds, [facility] 07/25/2023, 10:51 AM Please see Amion for pager number during day hours 7:00am-4:30pm, 7-11:30am on Weekends

## 2023-07-25 NOTE — TOC Transition Note (Signed)
 Transition of Care [facility]) - Discharge Note   Patient Details  Name: MILLIANA REDDOCH MRN: [id number] Date of Birth: Apr 16, 1945  Transition [facility]) CM/SW Contact:  Jerald Hennington E Izzy Doubek, LCSW Phone Number: 07/25/2023, 10:39 AM   Clinical Narrative:    PASRR #:  [id number]  Discharge to [facility] [place] today. Room 205. Confirmed with Admissions Worker Tracey. Updated PA, RN, patient, and son Ethelle Herb. Asked RN to call report. EMS paperwork completed. Patient added to list for PTAR, next on list, updated RN.    Final next level of care: Skilled Nursing Facility Barriers to Discharge: Barriers Resolved   Patient Goals and CMS Choice Patient states their goals for this hospitalization and ongoing recovery are:: "running marathons" CMS Medicare.gov Compare Post Acute Care list provided to:: Patient Choice offered to / list presented to : Patient      Discharge Placement              Patient chooses bed at: [facility] [place] [facility] Patient to be transferred to facility by: PTAR Name of family member notified: Ethelle Herb Patient and family notified of of transfer: 07/25/23  Discharge Plan and Services Additional resources added to the After Visit Summary for   In-house Referral: Clinical Social Work   Post Acute Care Choice: Skilled Nursing Facility                               Social Drivers of Health (SDOH) Interventions SDOH Screenings   Food Insecurity: No Food Insecurity (07/18/2023)  Housing: Low Risk  (07/18/2023)  Transportation Needs: No Transportation Needs (07/18/2023)  Utilities: Not At Risk (07/18/2023)  Social Connections: Socially Isolated (07/18/2023)  Tobacco Use: Medium Risk (07/18/2023)     Readmission Risk Interventions     No data to display

## 2023-07-25 NOTE — Progress Notes (Signed)
       Subjective: No complaints.  Remains stable  Objective: Vital signs in last 24 hours: Temp:  [97.7 F (36.5 C)-98.5 F (36.9 C)] 98 F (36.7 C) (05/20 0728) Pulse Rate:  [53-66] 53 (05/20 0728) Resp:  [16-17] 16 (05/20 0728) BP: (131-156)/(70-84) 149/70 (05/20 0728) SpO2:  [94 %-97 %] 95 % (05/20 0728) Last BM Date : 07/24/23  Intake/Output from previous day: 05/19 0701 - 05/20 0700 In: 720 [P.O.:720] Out: 600 [Urine:600] Intake/Output this shift: No intake/output data recorded.  PE: Gen: NAD, pleasant, up in chair with brace on HEENT: PERRL Lungs: effort nonlabored on RA Ext: MAEs  Psych: A&Ox3  Lab Results:  No results for input(s): "WBC", "HGB", "HCT", "PLT" in the last 72 hours.  BMET Recent Labs    07/25/23 0721  CREATININE 0.94    PT/INR No results for input(s): "LABPROT", "INR" in the last 72 hours. CMP     Component Value Date/Time   NA 140 07/21/2023 0426   K 3.6 07/21/2023 0426   CL 108 07/21/2023 0426   CO2 25 07/21/2023 0426   GLUCOSE 103 (H) 07/21/2023 0426   BUN 20 07/21/2023 0426   CREATININE 0.94 07/25/2023 0721   CALCIUM  8.6 (L) 07/21/2023 0426   PROT 7.7 07/18/2023 1310   ALBUMIN 4.4 07/18/2023 1310   AST 19 07/18/2023 1310   ALT 20 07/18/2023 1310   ALKPHOS 91 07/18/2023 1310   BILITOT 0.9 07/18/2023 1310   GFRNONAA >60 07/25/2023 0721   GFRAA >60 11/05/2015 1305   Lipase  No results found for: "LIPASE"     Studies/Results: No results found.   Anti-infectives: Anti-infectives (From admission, onward)    None        Assessment/Plan GLF, s/p multiple recent falls L rib fx 3-7 - pulm toilet, pain control, IS, mobilize with therapies T12 compression fx - NSGY, Nundkumar to see. Rec TLSO brace for now which is in the room AKI/decrease UOP - Cr has normalized today with good UOP. SLIV GERD Depression - home meds Anxiety  HTN - restart home meds HLD - restart home meds H/o CVA in 2023 per patient - she  states this is likely why she is on the Plavix FEN - regular, SLIV VTE - lovenox , apparently takes Plavix, will hold for now, especially given frequent falls.   ID - none currently, monitor UA Dispo - remains stable for SNF.  Can go when PASRR approved  I reviewed nursing notes, last 24 h vitals and pain scores, last 48 h intake and output, last 24 h labs and trends, and last 24 h imaging results.   LOS: 7 days    Leone Ralphs , [facility] Surgery 07/25/2023, 9:58 AM Please see Amion for pager number during day hours 7:00am-4:30pm or 7:00am -11:30am on weekends

## 2023-07-25 NOTE — Progress Notes (Signed)
 Discharge summary packet/pertinent documents provided to PTAR , Pt d/c to [facility] as ordered, She remains alert/oriented in no apparent distress. Report given to [facility], staff nurse AT [facility]. All questions and concerns were fully answered.

## 2023-07-25 NOTE — TOC Progression Note (Addendum)
 Transition of Care [facility]) - Progression Note    Patient Details  Name: Selena Morris MRN: [id number] Date of Birth: [birth year]  Transition of Care [facility]) CM/SW Contact  Radiance Deady E Lyzette Reinhardt, LCSW Phone Number: 07/25/2023, 9:17 AM  Clinical Narrative:    Checked [facility], PASRR is still pending. Called [facility], spoke with Tonya, explained patient has been medically ready for SNF since Friday and just waiting on PASRR. Lynnie Saucier stated a nurse was assigned this morning, and that it hopefully will be back today - states she can't guarantee due to high caseloads.  Called Tracy at [employer], left VM with update.   Expected Discharge Plan: Skilled Nursing Facility Barriers to Discharge: Continued Medical Work up, SNF Pending bed offer  Expected Discharge Plan and Services In-house Referral: Clinical Social Work   Post Acute Care Choice: Skilled Nursing Facility Living arrangements for the past 2 months: Single Family Home Expected Discharge Date: 07/24/23                                     Social Determinants of Health (SDOH) Interventions SDOH Screenings   Food Insecurity: No Food Insecurity (07/18/2023)  Housing: Low Risk  (07/18/2023)  Transportation Needs: No Transportation Needs (07/18/2023)  Utilities: Not At Risk (07/18/2023)  Social Connections: Socially Isolated (07/18/2023)  Tobacco Use: Medium Risk (07/18/2023)    Readmission Risk Interventions     No data to display

## 2023-07-25 NOTE — Plan of Care (Signed)
  Problem: Education: Goal: Knowledge of General Education information will improve Description: Including pain rating scale, medication(s)/side effects and non-pharmacologic comfort measures Outcome: Adequate for Discharge   Problem: Activity: Goal: Risk for activity intolerance will decrease Outcome: Adequate for Discharge   Problem: Nutrition: Goal: Adequate nutrition will be maintained Outcome: Adequate for Discharge   Problem: Elimination: Goal: Will not experience complications related to bowel motility Outcome: Adequate for Discharge   Problem: Pain Managment: Goal: General experience of comfort will improve and/or be controlled Outcome: Adequate for Discharge

## 2023-08-11 DIAGNOSIS — S22000A Wedge compression fracture of unspecified thoracic vertebra, initial encounter for closed fracture: Secondary | ICD-10-CM | POA: Diagnosis not present

## 2023-08-11 DIAGNOSIS — S2242XA Multiple fractures of ribs, left side, initial encounter for closed fracture: Secondary | ICD-10-CM | POA: Diagnosis not present

## 2023-08-11 DIAGNOSIS — Z66 Do not resuscitate: Secondary | ICD-10-CM | POA: Diagnosis not present

## 2023-08-11 DIAGNOSIS — F329 Major depressive disorder, single episode, unspecified: Secondary | ICD-10-CM | POA: Diagnosis not present

## 2023-08-11 DIAGNOSIS — F411 Generalized anxiety disorder: Secondary | ICD-10-CM | POA: Diagnosis not present

## 2023-08-11 DIAGNOSIS — Z9181 History of falling: Secondary | ICD-10-CM | POA: Diagnosis not present

## 2023-08-11 DIAGNOSIS — I10 Essential (primary) hypertension: Secondary | ICD-10-CM | POA: Diagnosis not present

## 2023-08-11 DIAGNOSIS — K219 Gastro-esophageal reflux disease without esophagitis: Secondary | ICD-10-CM | POA: Diagnosis not present

## 2023-08-11 DIAGNOSIS — E785 Hyperlipidemia, unspecified: Secondary | ICD-10-CM | POA: Diagnosis not present

## 2023-08-14 DIAGNOSIS — F331 Major depressive disorder, recurrent, moderate: Secondary | ICD-10-CM | POA: Diagnosis not present

## 2023-08-14 DIAGNOSIS — F429 Obsessive-compulsive disorder, unspecified: Secondary | ICD-10-CM | POA: Diagnosis not present

## 2023-08-14 DIAGNOSIS — F988 Other specified behavioral and emotional disorders with onset usually occurring in childhood and adolescence: Secondary | ICD-10-CM | POA: Diagnosis not present

## 2023-08-24 DIAGNOSIS — R2689 Other abnormalities of gait and mobility: Secondary | ICD-10-CM | POA: Diagnosis not present

## 2023-08-24 DIAGNOSIS — S2242XD Multiple fractures of ribs, left side, subsequent encounter for fracture with routine healing: Secondary | ICD-10-CM | POA: Diagnosis not present

## 2023-08-24 DIAGNOSIS — Z87891 Personal history of nicotine dependence: Secondary | ICD-10-CM | POA: Diagnosis not present

## 2023-08-24 DIAGNOSIS — Z9181 History of falling: Secondary | ICD-10-CM | POA: Diagnosis not present

## 2023-08-24 DIAGNOSIS — F32A Depression, unspecified: Secondary | ICD-10-CM | POA: Diagnosis not present

## 2023-08-24 DIAGNOSIS — E7800 Pure hypercholesterolemia, unspecified: Secondary | ICD-10-CM | POA: Diagnosis not present

## 2023-08-24 DIAGNOSIS — H401230 Low-tension glaucoma, bilateral, stage unspecified: Secondary | ICD-10-CM | POA: Diagnosis not present

## 2023-08-24 DIAGNOSIS — I10 Essential (primary) hypertension: Secondary | ICD-10-CM | POA: Diagnosis not present

## 2023-08-24 DIAGNOSIS — I69398 Other sequelae of cerebral infarction: Secondary | ICD-10-CM | POA: Diagnosis not present

## 2023-08-24 DIAGNOSIS — E559 Vitamin D deficiency, unspecified: Secondary | ICD-10-CM | POA: Diagnosis not present

## 2023-08-24 DIAGNOSIS — S22080D Wedge compression fracture of T11-T12 vertebra, subsequent encounter for fracture with routine healing: Secondary | ICD-10-CM | POA: Diagnosis not present

## 2023-08-24 DIAGNOSIS — N179 Acute kidney failure, unspecified: Secondary | ICD-10-CM | POA: Diagnosis not present

## 2023-08-24 DIAGNOSIS — Z7902 Long term (current) use of antithrombotics/antiplatelets: Secondary | ICD-10-CM | POA: Diagnosis not present

## 2023-08-24 DIAGNOSIS — K219 Gastro-esophageal reflux disease without esophagitis: Secondary | ICD-10-CM | POA: Diagnosis not present

## 2023-08-24 DIAGNOSIS — R41841 Cognitive communication deficit: Secondary | ICD-10-CM | POA: Diagnosis not present

## 2023-08-24 DIAGNOSIS — F419 Anxiety disorder, unspecified: Secondary | ICD-10-CM | POA: Diagnosis not present

## 2023-08-28 DIAGNOSIS — S22080D Wedge compression fracture of T11-T12 vertebra, subsequent encounter for fracture with routine healing: Secondary | ICD-10-CM | POA: Diagnosis not present

## 2023-08-28 DIAGNOSIS — F32A Depression, unspecified: Secondary | ICD-10-CM | POA: Diagnosis not present

## 2023-08-28 DIAGNOSIS — S2242XD Multiple fractures of ribs, left side, subsequent encounter for fracture with routine healing: Secondary | ICD-10-CM | POA: Diagnosis not present

## 2023-08-28 DIAGNOSIS — R2689 Other abnormalities of gait and mobility: Secondary | ICD-10-CM | POA: Diagnosis not present

## 2023-08-28 DIAGNOSIS — I10 Essential (primary) hypertension: Secondary | ICD-10-CM | POA: Diagnosis not present

## 2023-08-28 DIAGNOSIS — I69398 Other sequelae of cerebral infarction: Secondary | ICD-10-CM | POA: Diagnosis not present

## 2023-08-29 DIAGNOSIS — Z09 Encounter for follow-up examination after completed treatment for conditions other than malignant neoplasm: Secondary | ICD-10-CM | POA: Diagnosis not present

## 2023-08-29 DIAGNOSIS — R32 Unspecified urinary incontinence: Secondary | ICD-10-CM | POA: Diagnosis not present

## 2023-08-29 DIAGNOSIS — E7800 Pure hypercholesterolemia, unspecified: Secondary | ICD-10-CM | POA: Diagnosis not present

## 2023-08-29 DIAGNOSIS — G3184 Mild cognitive impairment, so stated: Secondary | ICD-10-CM | POA: Diagnosis not present

## 2023-08-29 DIAGNOSIS — M810 Age-related osteoporosis without current pathological fracture: Secondary | ICD-10-CM | POA: Diagnosis not present

## 2023-08-29 DIAGNOSIS — I679 Cerebrovascular disease, unspecified: Secondary | ICD-10-CM | POA: Diagnosis not present

## 2023-08-29 DIAGNOSIS — R7309 Other abnormal glucose: Secondary | ICD-10-CM | POA: Diagnosis not present

## 2023-08-29 DIAGNOSIS — S2249XA Multiple fractures of ribs, unspecified side, initial encounter for closed fracture: Secondary | ICD-10-CM | POA: Diagnosis not present

## 2023-08-29 DIAGNOSIS — I6930 Unspecified sequelae of cerebral infarction: Secondary | ICD-10-CM | POA: Diagnosis not present

## 2023-08-29 DIAGNOSIS — K219 Gastro-esophageal reflux disease without esophagitis: Secondary | ICD-10-CM | POA: Diagnosis not present

## 2023-08-29 DIAGNOSIS — S22000A Wedge compression fracture of unspecified thoracic vertebra, initial encounter for closed fracture: Secondary | ICD-10-CM | POA: Diagnosis not present

## 2023-08-30 DIAGNOSIS — S22080D Wedge compression fracture of T11-T12 vertebra, subsequent encounter for fracture with routine healing: Secondary | ICD-10-CM | POA: Diagnosis not present

## 2023-08-30 DIAGNOSIS — F32A Depression, unspecified: Secondary | ICD-10-CM | POA: Diagnosis not present

## 2023-08-30 DIAGNOSIS — S2242XD Multiple fractures of ribs, left side, subsequent encounter for fracture with routine healing: Secondary | ICD-10-CM | POA: Diagnosis not present

## 2023-08-30 DIAGNOSIS — I69398 Other sequelae of cerebral infarction: Secondary | ICD-10-CM | POA: Diagnosis not present

## 2023-08-30 DIAGNOSIS — R2689 Other abnormalities of gait and mobility: Secondary | ICD-10-CM | POA: Diagnosis not present

## 2023-08-30 DIAGNOSIS — I10 Essential (primary) hypertension: Secondary | ICD-10-CM | POA: Diagnosis not present

## 2023-09-04 DIAGNOSIS — F32A Depression, unspecified: Secondary | ICD-10-CM | POA: Diagnosis not present

## 2023-09-04 DIAGNOSIS — S22080D Wedge compression fracture of T11-T12 vertebra, subsequent encounter for fracture with routine healing: Secondary | ICD-10-CM | POA: Diagnosis not present

## 2023-09-04 DIAGNOSIS — I69398 Other sequelae of cerebral infarction: Secondary | ICD-10-CM | POA: Diagnosis not present

## 2023-09-04 DIAGNOSIS — R2689 Other abnormalities of gait and mobility: Secondary | ICD-10-CM | POA: Diagnosis not present

## 2023-09-04 DIAGNOSIS — I10 Essential (primary) hypertension: Secondary | ICD-10-CM | POA: Diagnosis not present

## 2023-09-04 DIAGNOSIS — S2242XD Multiple fractures of ribs, left side, subsequent encounter for fracture with routine healing: Secondary | ICD-10-CM | POA: Diagnosis not present

## 2023-09-05 DIAGNOSIS — I69398 Other sequelae of cerebral infarction: Secondary | ICD-10-CM | POA: Diagnosis not present

## 2023-09-05 DIAGNOSIS — F32A Depression, unspecified: Secondary | ICD-10-CM | POA: Diagnosis not present

## 2023-09-05 DIAGNOSIS — R2689 Other abnormalities of gait and mobility: Secondary | ICD-10-CM | POA: Diagnosis not present

## 2023-09-05 DIAGNOSIS — F02B0 Dementia in other diseases classified elsewhere, moderate, without behavioral disturbance, psychotic disturbance, mood disturbance, and anxiety: Secondary | ICD-10-CM | POA: Diagnosis not present

## 2023-09-05 DIAGNOSIS — I10 Essential (primary) hypertension: Secondary | ICD-10-CM | POA: Diagnosis not present

## 2023-09-05 DIAGNOSIS — S22080D Wedge compression fracture of T11-T12 vertebra, subsequent encounter for fracture with routine healing: Secondary | ICD-10-CM | POA: Diagnosis not present

## 2023-09-05 DIAGNOSIS — S2242XD Multiple fractures of ribs, left side, subsequent encounter for fracture with routine healing: Secondary | ICD-10-CM | POA: Diagnosis not present

## 2023-09-11 DIAGNOSIS — F32A Depression, unspecified: Secondary | ICD-10-CM | POA: Diagnosis not present

## 2023-09-11 DIAGNOSIS — S2242XD Multiple fractures of ribs, left side, subsequent encounter for fracture with routine healing: Secondary | ICD-10-CM | POA: Diagnosis not present

## 2023-09-11 DIAGNOSIS — S22080D Wedge compression fracture of T11-T12 vertebra, subsequent encounter for fracture with routine healing: Secondary | ICD-10-CM | POA: Diagnosis not present

## 2023-09-11 DIAGNOSIS — R2689 Other abnormalities of gait and mobility: Secondary | ICD-10-CM | POA: Diagnosis not present

## 2023-09-11 DIAGNOSIS — I69398 Other sequelae of cerebral infarction: Secondary | ICD-10-CM | POA: Diagnosis not present

## 2023-09-11 DIAGNOSIS — I10 Essential (primary) hypertension: Secondary | ICD-10-CM | POA: Diagnosis not present

## 2023-09-13 DIAGNOSIS — F32A Depression, unspecified: Secondary | ICD-10-CM | POA: Diagnosis not present

## 2023-09-13 DIAGNOSIS — R2689 Other abnormalities of gait and mobility: Secondary | ICD-10-CM | POA: Diagnosis not present

## 2023-09-13 DIAGNOSIS — I69398 Other sequelae of cerebral infarction: Secondary | ICD-10-CM | POA: Diagnosis not present

## 2023-09-13 DIAGNOSIS — S22080D Wedge compression fracture of T11-T12 vertebra, subsequent encounter for fracture with routine healing: Secondary | ICD-10-CM | POA: Diagnosis not present

## 2023-09-13 DIAGNOSIS — S2242XD Multiple fractures of ribs, left side, subsequent encounter for fracture with routine healing: Secondary | ICD-10-CM | POA: Diagnosis not present

## 2023-09-13 DIAGNOSIS — I10 Essential (primary) hypertension: Secondary | ICD-10-CM | POA: Diagnosis not present

## 2023-09-18 DIAGNOSIS — R2689 Other abnormalities of gait and mobility: Secondary | ICD-10-CM | POA: Diagnosis not present

## 2023-09-18 DIAGNOSIS — I69398 Other sequelae of cerebral infarction: Secondary | ICD-10-CM | POA: Diagnosis not present

## 2023-09-18 DIAGNOSIS — S22080D Wedge compression fracture of T11-T12 vertebra, subsequent encounter for fracture with routine healing: Secondary | ICD-10-CM | POA: Diagnosis not present

## 2023-09-18 DIAGNOSIS — F32A Depression, unspecified: Secondary | ICD-10-CM | POA: Diagnosis not present

## 2023-09-18 DIAGNOSIS — I10 Essential (primary) hypertension: Secondary | ICD-10-CM | POA: Diagnosis not present

## 2023-09-18 DIAGNOSIS — S2242XD Multiple fractures of ribs, left side, subsequent encounter for fracture with routine healing: Secondary | ICD-10-CM | POA: Diagnosis not present

## 2023-09-20 DIAGNOSIS — S2242XD Multiple fractures of ribs, left side, subsequent encounter for fracture with routine healing: Secondary | ICD-10-CM | POA: Diagnosis not present

## 2023-09-20 DIAGNOSIS — I69398 Other sequelae of cerebral infarction: Secondary | ICD-10-CM | POA: Diagnosis not present

## 2023-09-20 DIAGNOSIS — S22080D Wedge compression fracture of T11-T12 vertebra, subsequent encounter for fracture with routine healing: Secondary | ICD-10-CM | POA: Diagnosis not present

## 2023-09-20 DIAGNOSIS — I10 Essential (primary) hypertension: Secondary | ICD-10-CM | POA: Diagnosis not present

## 2023-09-20 DIAGNOSIS — R2689 Other abnormalities of gait and mobility: Secondary | ICD-10-CM | POA: Diagnosis not present

## 2023-09-20 DIAGNOSIS — F32A Depression, unspecified: Secondary | ICD-10-CM | POA: Diagnosis not present

## 2023-09-23 DIAGNOSIS — K219 Gastro-esophageal reflux disease without esophagitis: Secondary | ICD-10-CM | POA: Diagnosis not present

## 2023-09-23 DIAGNOSIS — F32A Depression, unspecified: Secondary | ICD-10-CM | POA: Diagnosis not present

## 2023-09-23 DIAGNOSIS — F419 Anxiety disorder, unspecified: Secondary | ICD-10-CM | POA: Diagnosis not present

## 2023-09-23 DIAGNOSIS — Z7902 Long term (current) use of antithrombotics/antiplatelets: Secondary | ICD-10-CM | POA: Diagnosis not present

## 2023-09-23 DIAGNOSIS — E559 Vitamin D deficiency, unspecified: Secondary | ICD-10-CM | POA: Diagnosis not present

## 2023-09-23 DIAGNOSIS — E7800 Pure hypercholesterolemia, unspecified: Secondary | ICD-10-CM | POA: Diagnosis not present

## 2023-09-23 DIAGNOSIS — R41841 Cognitive communication deficit: Secondary | ICD-10-CM | POA: Diagnosis not present

## 2023-09-23 DIAGNOSIS — S2242XD Multiple fractures of ribs, left side, subsequent encounter for fracture with routine healing: Secondary | ICD-10-CM | POA: Diagnosis not present

## 2023-09-23 DIAGNOSIS — I10 Essential (primary) hypertension: Secondary | ICD-10-CM | POA: Diagnosis not present

## 2023-09-23 DIAGNOSIS — I69398 Other sequelae of cerebral infarction: Secondary | ICD-10-CM | POA: Diagnosis not present

## 2023-09-23 DIAGNOSIS — Z9181 History of falling: Secondary | ICD-10-CM | POA: Diagnosis not present

## 2023-09-23 DIAGNOSIS — S22080D Wedge compression fracture of T11-T12 vertebra, subsequent encounter for fracture with routine healing: Secondary | ICD-10-CM | POA: Diagnosis not present

## 2023-09-23 DIAGNOSIS — N179 Acute kidney failure, unspecified: Secondary | ICD-10-CM | POA: Diagnosis not present

## 2023-09-23 DIAGNOSIS — H401230 Low-tension glaucoma, bilateral, stage unspecified: Secondary | ICD-10-CM | POA: Diagnosis not present

## 2023-09-23 DIAGNOSIS — Z87891 Personal history of nicotine dependence: Secondary | ICD-10-CM | POA: Diagnosis not present

## 2023-09-23 DIAGNOSIS — R2689 Other abnormalities of gait and mobility: Secondary | ICD-10-CM | POA: Diagnosis not present

## 2023-09-25 DIAGNOSIS — F32A Depression, unspecified: Secondary | ICD-10-CM | POA: Diagnosis not present

## 2023-09-25 DIAGNOSIS — I69398 Other sequelae of cerebral infarction: Secondary | ICD-10-CM | POA: Diagnosis not present

## 2023-09-25 DIAGNOSIS — S22080D Wedge compression fracture of T11-T12 vertebra, subsequent encounter for fracture with routine healing: Secondary | ICD-10-CM | POA: Diagnosis not present

## 2023-09-25 DIAGNOSIS — I10 Essential (primary) hypertension: Secondary | ICD-10-CM | POA: Diagnosis not present

## 2023-09-25 DIAGNOSIS — S2242XD Multiple fractures of ribs, left side, subsequent encounter for fracture with routine healing: Secondary | ICD-10-CM | POA: Diagnosis not present

## 2023-09-25 DIAGNOSIS — R2689 Other abnormalities of gait and mobility: Secondary | ICD-10-CM | POA: Diagnosis not present

## 2023-09-26 DIAGNOSIS — I69398 Other sequelae of cerebral infarction: Secondary | ICD-10-CM | POA: Diagnosis not present

## 2023-09-26 DIAGNOSIS — S2242XD Multiple fractures of ribs, left side, subsequent encounter for fracture with routine healing: Secondary | ICD-10-CM | POA: Diagnosis not present

## 2023-09-26 DIAGNOSIS — F32A Depression, unspecified: Secondary | ICD-10-CM | POA: Diagnosis not present

## 2023-09-26 DIAGNOSIS — R2689 Other abnormalities of gait and mobility: Secondary | ICD-10-CM | POA: Diagnosis not present

## 2023-09-26 DIAGNOSIS — I10 Essential (primary) hypertension: Secondary | ICD-10-CM | POA: Diagnosis not present

## 2023-09-26 DIAGNOSIS — S22080D Wedge compression fracture of T11-T12 vertebra, subsequent encounter for fracture with routine healing: Secondary | ICD-10-CM | POA: Diagnosis not present

## 2023-09-28 DIAGNOSIS — S22080A Wedge compression fracture of T11-T12 vertebra, initial encounter for closed fracture: Secondary | ICD-10-CM | POA: Diagnosis not present

## 2023-10-03 DIAGNOSIS — I69398 Other sequelae of cerebral infarction: Secondary | ICD-10-CM | POA: Diagnosis not present

## 2023-10-03 DIAGNOSIS — S22080D Wedge compression fracture of T11-T12 vertebra, subsequent encounter for fracture with routine healing: Secondary | ICD-10-CM | POA: Diagnosis not present

## 2023-10-03 DIAGNOSIS — R2689 Other abnormalities of gait and mobility: Secondary | ICD-10-CM | POA: Diagnosis not present

## 2023-10-03 DIAGNOSIS — F32A Depression, unspecified: Secondary | ICD-10-CM | POA: Diagnosis not present

## 2023-10-03 DIAGNOSIS — S2242XD Multiple fractures of ribs, left side, subsequent encounter for fracture with routine healing: Secondary | ICD-10-CM | POA: Diagnosis not present

## 2023-10-03 DIAGNOSIS — I10 Essential (primary) hypertension: Secondary | ICD-10-CM | POA: Diagnosis not present

## 2023-10-06 DIAGNOSIS — F02B0 Dementia in other diseases classified elsewhere, moderate, without behavioral disturbance, psychotic disturbance, mood disturbance, and anxiety: Secondary | ICD-10-CM | POA: Diagnosis not present

## 2023-10-09 DIAGNOSIS — I10 Essential (primary) hypertension: Secondary | ICD-10-CM | POA: Diagnosis not present

## 2023-10-09 DIAGNOSIS — F32A Depression, unspecified: Secondary | ICD-10-CM | POA: Diagnosis not present

## 2023-10-09 DIAGNOSIS — R2689 Other abnormalities of gait and mobility: Secondary | ICD-10-CM | POA: Diagnosis not present

## 2023-10-09 DIAGNOSIS — S22080D Wedge compression fracture of T11-T12 vertebra, subsequent encounter for fracture with routine healing: Secondary | ICD-10-CM | POA: Diagnosis not present

## 2023-10-09 DIAGNOSIS — S2242XD Multiple fractures of ribs, left side, subsequent encounter for fracture with routine healing: Secondary | ICD-10-CM | POA: Diagnosis not present

## 2023-10-09 DIAGNOSIS — I69398 Other sequelae of cerebral infarction: Secondary | ICD-10-CM | POA: Diagnosis not present

## 2023-10-10 IMAGING — MR MR HEAD WO/W CM
12 series · 48 of 48 positions shown · IV contrast (multihance)
Comparison: Head CT 08/22/2013

CLINICAL DATA: Provided history: Balance problems. Bilateral leg
cramps. Additional history provided by scanning technologist:
generalized weakness, dry eyes, difficulty walking.

EXAM:
MRI HEAD WITHOUT AND WITH CONTRAST
TECHNIQUE: Multiplanar, multiecho pulse sequences of the brain and surrounding
structures were obtained without and with intravenous contrast.
CONTRAST:  12mL MULTIHANCE GADOBENATE DIMEGLUMINE 529 MG/ML IV SOLN

[Series 2: T1 · sagittal · 5.0mm · 0.45mm/px · 2 of 24 slices shown]
[im 1/24]
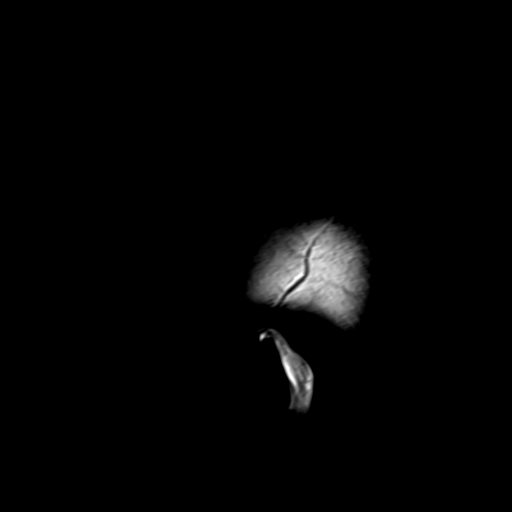
[im 24/24]
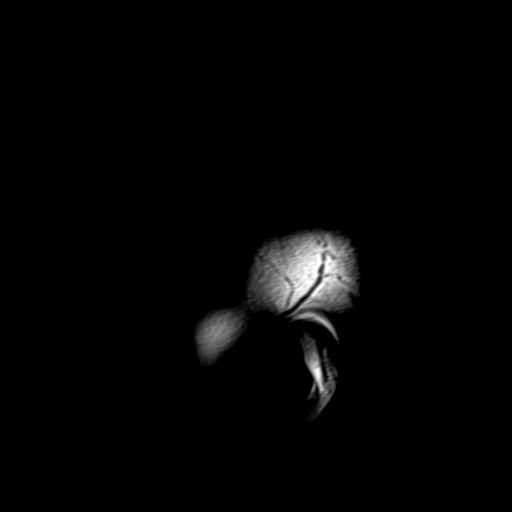

[Series 3: ax ep2d_diff_3 · axial · 3.0mm · 1.80mm/px · z∈[-36,+106]mm · 6 of 102 slices shown]
[im 1/102]
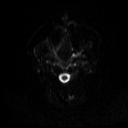
[im 21/102]
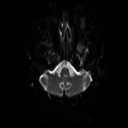
[im 41/102]
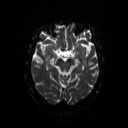
[im 61/102]
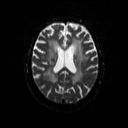
[im 81/102]
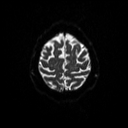
[im 102/102]
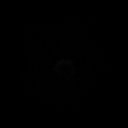

[Series 4: ax ep2d_diff_3_adc · axial · 3.0mm · 1.80mm/px · z∈[-36,+106]mm · 3 of 53 slices shown]
[im 1/53]
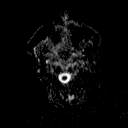
[im 27/53]
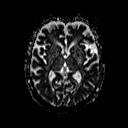
[im 53/53]
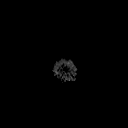

[Series 5: cor ep2d_diff · coronal · 5.0mm · 1.77mm/px · 3 of 52 slices shown]
[im 1/52]
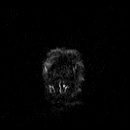
[im 26/52]
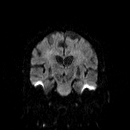
[im 52/52]
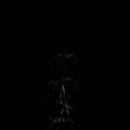

[Series 6: cor ep2d_diff_adc · coronal · 5.0mm · 1.77mm/px · 2 of 26 slices shown]
[im 1/26]
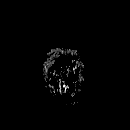
[im 26/26]
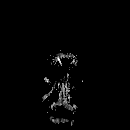

[Series 8: swi_images · axial · 2.0mm · 0.98mm/px · z∈[-13,+115]mm · 4 of 72 slices shown]
[im 1/72]
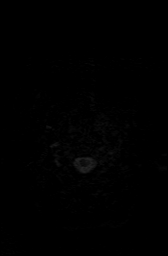
[im 24/72]
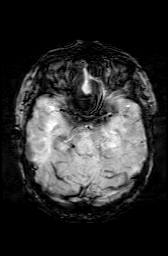
[im 48/72]
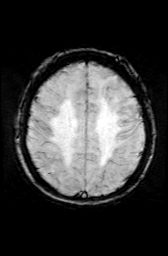
[im 72/72]
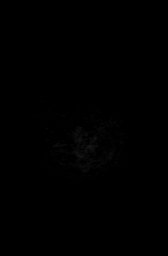

[Series 9: FLAIR · axial · 3.0mm · 0.45mm/px · z∈[-21,+115]mm · 2 of 40 slices shown]
[im 1/40]
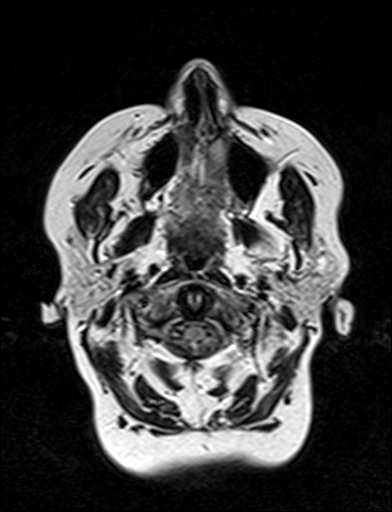
[im 40/40]
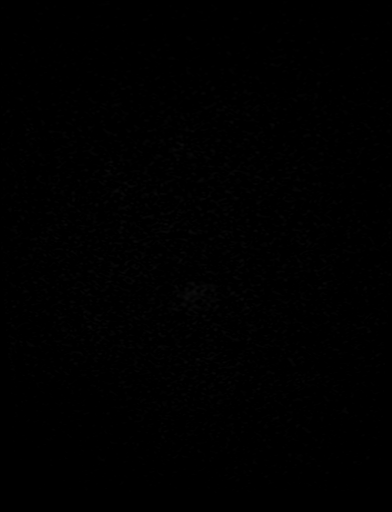

[Series 10: T2 · axial · 5.0mm · 0.65mm/px · z∈[-18,+117]mm · 2 of 26 slices shown (1 of 2)]
[im 1/26]
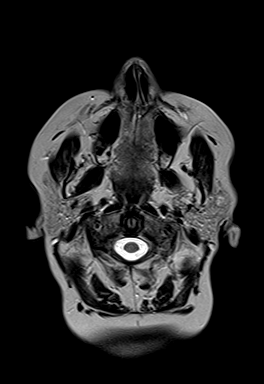
[im 26/26]
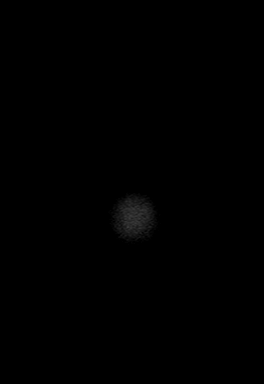

[Series 11: t1_mpr_tra · axial · 1.0mm · 0.78mm/px · z∈[-18,+124]mm · 10 of 160 slices shown]
[im 1/160]
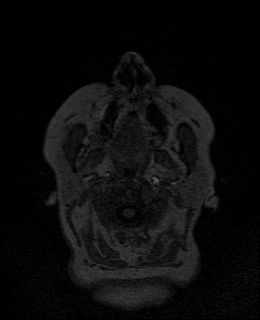
[im 18/160]
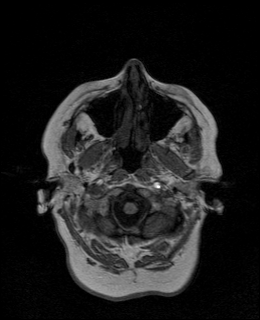
[im 36/160]
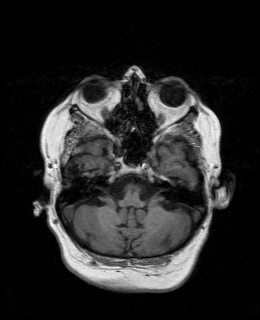
[im 54/160]
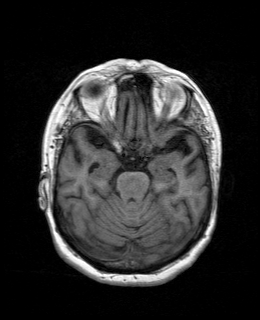
[im 71/160]
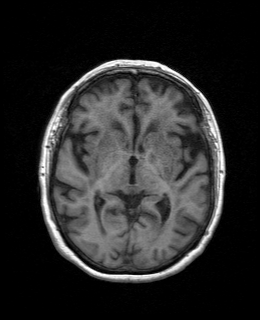
[im 89/160]
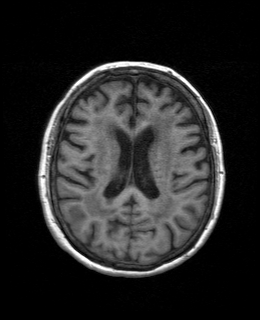
[im 107/160]
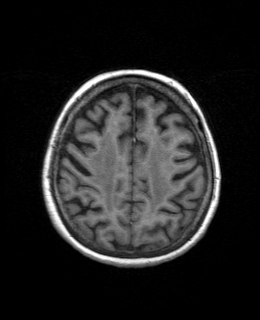
[im 124/160]
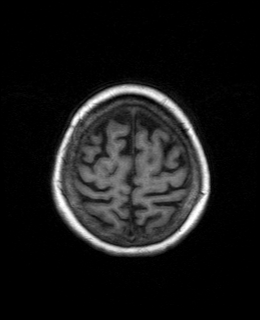
[im 142/160]
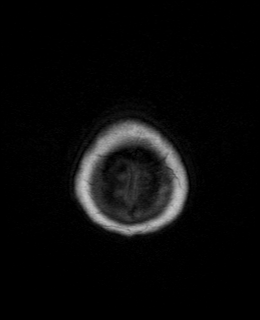
[im 160/160]
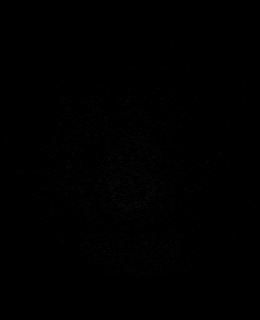

[Series 12: T2 · coronal · 5.0mm · 0.43mm/px · 2 of 28 slices shown (2 of 2)]
[im 1/28]
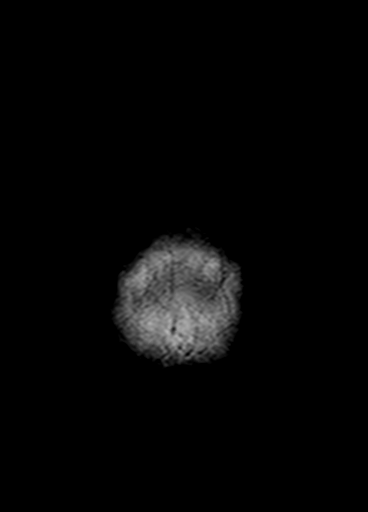
[im 28/28]
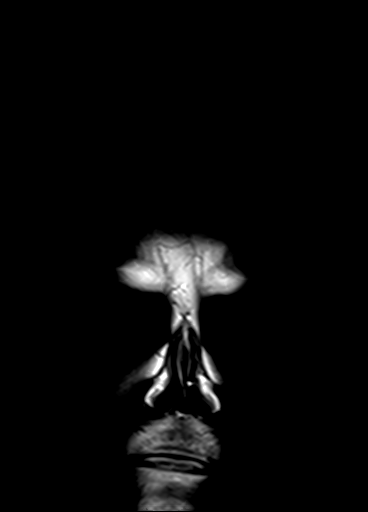

[Series 13: post t1_mpr_tra · axial · 1.0mm · 0.78mm/px · z∈[-17,+125]mm · 10 of 160 slices shown]
[im 1/160]
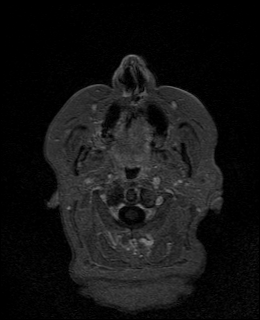
[im 18/160]
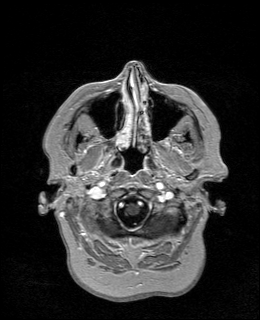
[im 36/160]
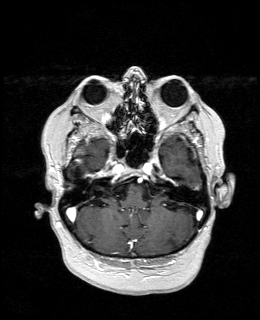
[im 54/160]
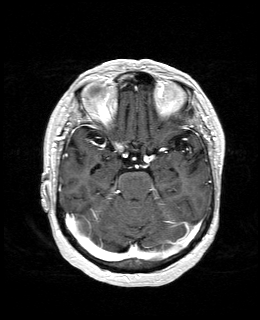
[im 71/160]
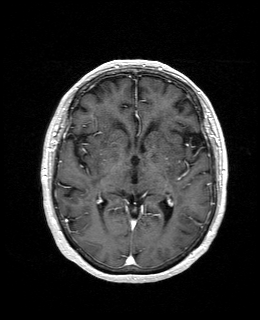
[im 89/160]
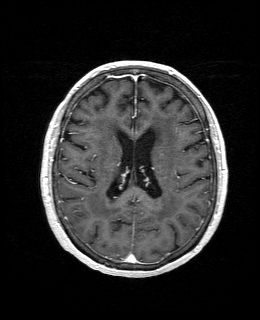
[im 107/160]
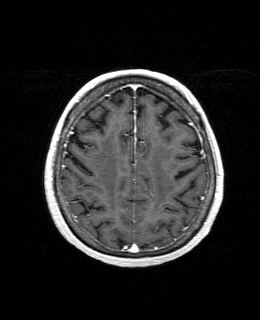
[im 124/160]
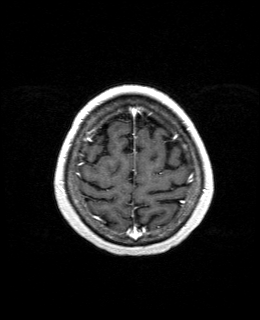
[im 142/160]
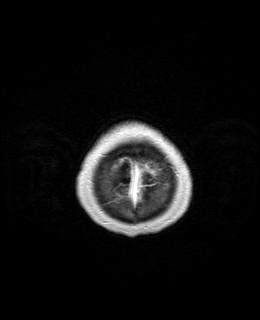
[im 160/160]
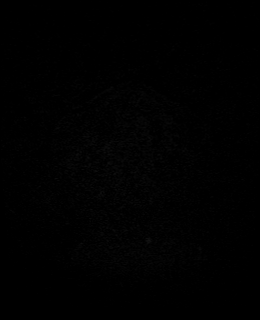

[Series 14: T1 post-contrast · coronal · 5.0mm · 0.72mm/px · 2 of 28 slices shown]
[im 1/28]
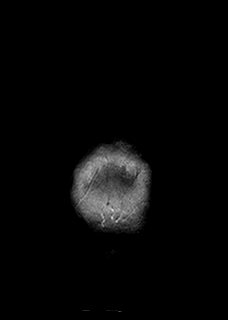
[im 28/28]
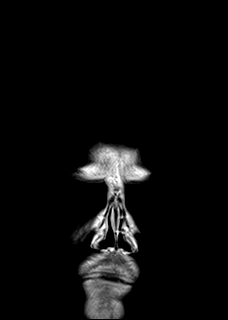

[48 of 48 positions shown; findings below may reference images not displayed]

FINDINGS: Brain:

Mild generalized cerebral atrophy.

Advanced patchy and confluent T2 FLAIR hyperintense signal
abnormality within the cerebral white matter, nonspecific but
compatible chronic small vessel disease.

Chronic small vessel ischemic changes are also present within the
bilateral thalami and pons. This includes chronic lacunar infarcts
within the bilateral thalami (series 10, image 13).

Background prominent perivascular spaces within the bilateral deep
gray nuclei.

Punctate chronic microhemorrhage within the posterior left frontal
lobe (series 8, image 43).

There is no acute infarct.

No evidence of an intracranial mass.

No extra-axial fluid collection.

No midline shift.

No pathologic intracranial enhancement identified.

Vascular: Maintained flow voids within the proximal large arterial
vessels.

Skull and upper cervical spine: No focal suspicious marrow lesion.

Sinuses/Orbits: No mass or acute finding within the imaged orbits.
Prior bilateral ocular lens replacement. Minimal mucosal thickening
within the bilateral ethmoid sinuses.

Other: Trace fluid within the bilateral mastoid air cells.
IMPRESSION: No evidence of acute intracranial abnormality.

Advanced chronic small vessel ischemic changes within the cerebral
white matter. Chronic small-vessel ischemic changes also present
within the thalami and pons, including chronic thalamic lacunar
infarcts.

Mild generalized cerebral atrophy.

## 2023-10-12 DIAGNOSIS — R2689 Other abnormalities of gait and mobility: Secondary | ICD-10-CM | POA: Diagnosis not present

## 2023-10-12 DIAGNOSIS — I10 Essential (primary) hypertension: Secondary | ICD-10-CM | POA: Diagnosis not present

## 2023-10-12 DIAGNOSIS — I69398 Other sequelae of cerebral infarction: Secondary | ICD-10-CM | POA: Diagnosis not present

## 2023-10-12 DIAGNOSIS — S2242XD Multiple fractures of ribs, left side, subsequent encounter for fracture with routine healing: Secondary | ICD-10-CM | POA: Diagnosis not present

## 2023-10-12 DIAGNOSIS — F32A Depression, unspecified: Secondary | ICD-10-CM | POA: Diagnosis not present

## 2023-10-12 DIAGNOSIS — S22080D Wedge compression fracture of T11-T12 vertebra, subsequent encounter for fracture with routine healing: Secondary | ICD-10-CM | POA: Diagnosis not present

## 2023-10-19 DIAGNOSIS — S2242XD Multiple fractures of ribs, left side, subsequent encounter for fracture with routine healing: Secondary | ICD-10-CM | POA: Diagnosis not present

## 2023-10-19 DIAGNOSIS — I10 Essential (primary) hypertension: Secondary | ICD-10-CM | POA: Diagnosis not present

## 2023-10-19 DIAGNOSIS — R2689 Other abnormalities of gait and mobility: Secondary | ICD-10-CM | POA: Diagnosis not present

## 2023-10-19 DIAGNOSIS — S22080D Wedge compression fracture of T11-T12 vertebra, subsequent encounter for fracture with routine healing: Secondary | ICD-10-CM | POA: Diagnosis not present

## 2023-10-19 DIAGNOSIS — I69398 Other sequelae of cerebral infarction: Secondary | ICD-10-CM | POA: Diagnosis not present

## 2023-10-19 DIAGNOSIS — F32A Depression, unspecified: Secondary | ICD-10-CM | POA: Diagnosis not present

## 2023-11-06 DIAGNOSIS — F02B0 Dementia in other diseases classified elsewhere, moderate, without behavioral disturbance, psychotic disturbance, mood disturbance, and anxiety: Secondary | ICD-10-CM | POA: Diagnosis not present

## 2023-11-21 DIAGNOSIS — E785 Hyperlipidemia, unspecified: Secondary | ICD-10-CM | POA: Diagnosis not present

## 2023-11-21 DIAGNOSIS — M810 Age-related osteoporosis without current pathological fracture: Secondary | ICD-10-CM | POA: Diagnosis not present

## 2023-11-21 DIAGNOSIS — R7309 Other abnormal glucose: Secondary | ICD-10-CM | POA: Diagnosis not present

## 2023-11-21 DIAGNOSIS — I10 Essential (primary) hypertension: Secondary | ICD-10-CM | POA: Diagnosis not present

## 2023-11-21 DIAGNOSIS — E559 Vitamin D deficiency, unspecified: Secondary | ICD-10-CM | POA: Diagnosis not present

## 2023-11-28 ENCOUNTER — Other Ambulatory Visit: Payer: Self-pay | Admitting: Family Medicine

## 2023-11-28 DIAGNOSIS — K219 Gastro-esophageal reflux disease without esophagitis: Secondary | ICD-10-CM | POA: Diagnosis not present

## 2023-11-28 DIAGNOSIS — I10 Essential (primary) hypertension: Secondary | ICD-10-CM | POA: Diagnosis not present

## 2023-11-28 DIAGNOSIS — E785 Hyperlipidemia, unspecified: Secondary | ICD-10-CM | POA: Diagnosis not present

## 2023-11-28 DIAGNOSIS — Z1231 Encounter for screening mammogram for malignant neoplasm of breast: Secondary | ICD-10-CM

## 2023-11-28 DIAGNOSIS — Z0000 Encounter for general adult medical examination without abnormal findings: Secondary | ICD-10-CM | POA: Diagnosis not present

## 2023-11-28 DIAGNOSIS — M810 Age-related osteoporosis without current pathological fracture: Secondary | ICD-10-CM | POA: Diagnosis not present

## 2023-11-28 DIAGNOSIS — F429 Obsessive-compulsive disorder, unspecified: Secondary | ICD-10-CM | POA: Diagnosis not present

## 2023-11-28 DIAGNOSIS — R7309 Other abnormal glucose: Secondary | ICD-10-CM | POA: Diagnosis not present

## 2023-11-28 DIAGNOSIS — E559 Vitamin D deficiency, unspecified: Secondary | ICD-10-CM | POA: Diagnosis not present

## 2023-11-28 DIAGNOSIS — G3184 Mild cognitive impairment, so stated: Secondary | ICD-10-CM | POA: Diagnosis not present

## 2023-11-28 DIAGNOSIS — F329 Major depressive disorder, single episode, unspecified: Secondary | ICD-10-CM | POA: Diagnosis not present

## 2023-11-28 DIAGNOSIS — Z23 Encounter for immunization: Secondary | ICD-10-CM | POA: Diagnosis not present

## 2023-11-28 DIAGNOSIS — I6930 Unspecified sequelae of cerebral infarction: Secondary | ICD-10-CM | POA: Diagnosis not present

## 2023-12-06 DIAGNOSIS — F02B0 Dementia in other diseases classified elsewhere, moderate, without behavioral disturbance, psychotic disturbance, mood disturbance, and anxiety: Secondary | ICD-10-CM | POA: Diagnosis not present

## 2023-12-27 ENCOUNTER — Ambulatory Visit

## 2024-01-03 ENCOUNTER — Ambulatory Visit
Admission: RE | Admit: 2024-01-03 | Discharge: 2024-01-03 | Disposition: A | Discharge status: Home / Self Care | Source: Ambulatory Visit | Attending: Family Medicine | Admitting: Family Medicine

## 2024-01-03 DIAGNOSIS — Z1231 Encounter for screening mammogram for malignant neoplasm of breast: Secondary | ICD-10-CM | POA: Diagnosis not present

## 2024-01-06 DIAGNOSIS — F02B0 Dementia in other diseases classified elsewhere, moderate, without behavioral disturbance, psychotic disturbance, mood disturbance, and anxiety: Secondary | ICD-10-CM | POA: Diagnosis not present
